# Patient Record
Sex: Male | Born: 1955 | Hispanic: No | Marital: Married | Smoking: Current every day smoker
Health system: Southern US, Community
[De-identification: ages and names within clinical notes are randomized; demographics above are authoritative.]

## PROBLEM LIST (undated history)

## (undated) DIAGNOSIS — G2581 Restless legs syndrome: Secondary | ICD-10-CM

## (undated) DIAGNOSIS — K219 Gastro-esophageal reflux disease without esophagitis: Secondary | ICD-10-CM

## (undated) DIAGNOSIS — I2510 Atherosclerotic heart disease of native coronary artery without angina pectoris: Secondary | ICD-10-CM

## (undated) DIAGNOSIS — E559 Vitamin D deficiency, unspecified: Secondary | ICD-10-CM

## (undated) DIAGNOSIS — E119 Type 2 diabetes mellitus without complications: Secondary | ICD-10-CM

## (undated) DIAGNOSIS — G4733 Obstructive sleep apnea (adult) (pediatric): Secondary | ICD-10-CM

## (undated) DIAGNOSIS — E7800 Pure hypercholesterolemia, unspecified: Secondary | ICD-10-CM

## (undated) DIAGNOSIS — M1990 Unspecified osteoarthritis, unspecified site: Secondary | ICD-10-CM

## (undated) DIAGNOSIS — Z8679 Personal history of other diseases of the circulatory system: Secondary | ICD-10-CM

## (undated) DIAGNOSIS — I712 Thoracic aortic aneurysm, without rupture, unspecified: Secondary | ICD-10-CM

## (undated) DIAGNOSIS — I10 Essential (primary) hypertension: Secondary | ICD-10-CM

## (undated) DIAGNOSIS — M545 Low back pain, unspecified: Secondary | ICD-10-CM

## (undated) DIAGNOSIS — G4730 Sleep apnea, unspecified: Secondary | ICD-10-CM

## (undated) DIAGNOSIS — M722 Plantar fascial fibromatosis: Secondary | ICD-10-CM

## (undated) DIAGNOSIS — Z972 Presence of dental prosthetic device (complete) (partial): Secondary | ICD-10-CM

## (undated) HISTORY — DX: Pure hypercholesterolemia, unspecified: E78.00

## (undated) HISTORY — DX: Thoracic aortic aneurysm, without rupture, unspecified: I71.20

## (undated) HISTORY — DX: Plantar fascial fibromatosis: M72.2

## (undated) HISTORY — PX: NECK SURGERY: SHX720

## (undated) HISTORY — DX: Obstructive sleep apnea (adult) (pediatric): G47.33

## (undated) HISTORY — PX: OTHER SURGICAL HISTORY: SHX169

---

## 2002-08-28 ENCOUNTER — Encounter: Admission: RE | Admit: 2002-08-28 | Discharge: 2002-08-28 | Payer: Self-pay | Admitting: Family Medicine

## 2002-08-29 ENCOUNTER — Encounter: Admission: RE | Admit: 2002-08-29 | Discharge: 2002-08-29 | Payer: Self-pay | Admitting: Family Medicine

## 2004-04-27 ENCOUNTER — Ambulatory Visit: Payer: Self-pay | Admitting: Family Medicine

## 2004-05-18 ENCOUNTER — Ambulatory Visit (HOSPITAL_COMMUNITY): Admission: RE | Admit: 2004-05-18 | Discharge: 2004-05-18 | Payer: Self-pay | Admitting: Sports Medicine

## 2004-07-15 ENCOUNTER — Ambulatory Visit: Payer: Self-pay | Admitting: Family Medicine

## 2004-10-31 ENCOUNTER — Emergency Department (HOSPITAL_COMMUNITY): Admission: EM | Admit: 2004-10-31 | Discharge: 2004-10-31 | Payer: Self-pay | Admitting: Emergency Medicine

## 2005-05-28 ENCOUNTER — Emergency Department (HOSPITAL_COMMUNITY): Admission: EM | Admit: 2005-05-28 | Discharge: 2005-05-28 | Payer: Self-pay | Admitting: Emergency Medicine

## 2005-06-22 ENCOUNTER — Ambulatory Visit: Payer: Self-pay | Admitting: Sports Medicine

## 2005-12-20 ENCOUNTER — Ambulatory Visit: Payer: Self-pay | Admitting: Family Medicine

## 2005-12-20 ENCOUNTER — Encounter: Admission: RE | Admit: 2005-12-20 | Discharge: 2005-12-20 | Payer: Self-pay | Admitting: Sports Medicine

## 2005-12-20 ENCOUNTER — Ambulatory Visit (HOSPITAL_COMMUNITY): Admission: RE | Admit: 2005-12-20 | Discharge: 2005-12-20 | Payer: Self-pay | Admitting: Family Medicine

## 2006-03-29 DIAGNOSIS — F17200 Nicotine dependence, unspecified, uncomplicated: Secondary | ICD-10-CM

## 2006-03-29 DIAGNOSIS — R079 Chest pain, unspecified: Secondary | ICD-10-CM

## 2006-03-29 DIAGNOSIS — E7800 Pure hypercholesterolemia, unspecified: Secondary | ICD-10-CM

## 2006-03-29 DIAGNOSIS — M545 Low back pain: Secondary | ICD-10-CM

## 2007-01-01 ENCOUNTER — Encounter (INDEPENDENT_AMBULATORY_CARE_PROVIDER_SITE_OTHER): Payer: Self-pay | Admitting: *Deleted

## 2007-01-01 ENCOUNTER — Ambulatory Visit: Payer: Self-pay | Admitting: Family Medicine

## 2007-01-01 ENCOUNTER — Telehealth: Payer: Self-pay | Admitting: *Deleted

## 2007-01-01 DIAGNOSIS — E119 Type 2 diabetes mellitus without complications: Secondary | ICD-10-CM

## 2007-01-01 LAB — CONVERTED CEMR LAB
ALT: 40 units/L (ref 0–53)
AST: 25 units/L (ref 0–37)
Albumin: 4.5 g/dL (ref 3.5–5.2)
Alkaline Phosphatase: 92 units/L (ref 39–117)
BUN: 11 mg/dL (ref 6–23)
Bilirubin Urine: NEGATIVE
Blood Glucose, AC Bkfst: 210 mg/dL
Blood in Urine, dipstick: NEGATIVE
CO2: 26 meq/L (ref 19–32)
Calcium: 9.8 mg/dL (ref 8.4–10.5)
Chloride: 99 meq/L (ref 96–112)
Creatinine, Ser: 0.89 mg/dL (ref 0.40–1.50)
Glucose, Bld: 198 mg/dL — ABNORMAL HIGH (ref 70–99)
Glucose, Urine, Semiquant: 100
Hgb A1c MFr Bld: 9.6 %
Ketones, urine, test strip: NEGATIVE
Nitrite: NEGATIVE
Potassium: 4.3 meq/L (ref 3.5–5.3)
Protein, U semiquant: NEGATIVE
Sodium: 136 meq/L (ref 135–145)
Specific Gravity, Urine: 1.01
Total Bilirubin: 0.7 mg/dL (ref 0.3–1.2)
Total Protein: 7.7 g/dL (ref 6.0–8.3)
Urobilinogen, UA: 0.2
WBC Urine, dipstick: NEGATIVE
pH: 6

## 2007-01-14 ENCOUNTER — Encounter: Payer: Self-pay | Admitting: Family Medicine

## 2007-05-09 ENCOUNTER — Encounter: Payer: Self-pay | Admitting: Family Medicine

## 2007-05-20 LAB — CONVERTED CEMR LAB
Cholesterol: 247 mg/dL — ABNORMAL HIGH (ref 0–200)
Glucose, Bld: 125 mg/dL — ABNORMAL HIGH (ref 70–99)
HDL: 44 mg/dL (ref >39)
LDL Cholesterol: 145 mg/dL — ABNORMAL HIGH (ref 0–99)
Total CHOL/HDL Ratio: 5.6
Triglycerides: 292 mg/dL — ABNORMAL HIGH (ref <150)
VLDL: 58 mg/dL — ABNORMAL HIGH (ref 0–40)

## 2007-12-31 ENCOUNTER — Ambulatory Visit: Payer: Self-pay | Admitting: Family Medicine

## 2007-12-31 ENCOUNTER — Encounter: Payer: Self-pay | Admitting: Family Medicine

## 2007-12-31 LAB — CONVERTED CEMR LAB
ALT: 21 units/L (ref 0–53)
AST: 14 units/L (ref 0–37)
Albumin: 4.5 g/dL (ref 3.5–5.2)
Alkaline Phosphatase: 83 units/L (ref 39–117)
BUN: 17 mg/dL (ref 6–23)
CO2: 23 meq/L (ref 19–32)
Calcium: 9.8 mg/dL (ref 8.4–10.5)
Chloride: 102 meq/L (ref 96–112)
Creatinine, Ser: 0.77 mg/dL (ref 0.40–1.50)
Direct LDL: 209 mg/dL — ABNORMAL HIGH
Glucose, Bld: 132 mg/dL — ABNORMAL HIGH (ref 70–99)
Hgb A1c MFr Bld: 7.3 %
Potassium: 4.1 meq/L (ref 3.5–5.3)
Sodium: 137 meq/L (ref 135–145)
Total Bilirubin: 0.5 mg/dL (ref 0.3–1.2)
Total Protein: 7.5 g/dL (ref 6.0–8.3)

## 2008-01-01 ENCOUNTER — Encounter: Payer: Self-pay | Admitting: Family Medicine

## 2008-02-05 ENCOUNTER — Encounter: Payer: Self-pay | Admitting: Family Medicine

## 2008-02-05 ENCOUNTER — Ambulatory Visit: Payer: Self-pay | Admitting: Family Medicine

## 2008-02-05 LAB — CONVERTED CEMR LAB: PSA: 0.37 ng/mL (ref 0.10–4.00)

## 2008-02-19 ENCOUNTER — Encounter: Payer: Self-pay | Admitting: Family Medicine

## 2008-04-25 ENCOUNTER — Emergency Department (HOSPITAL_BASED_OUTPATIENT_CLINIC_OR_DEPARTMENT_OTHER): Admission: EM | Admit: 2008-04-25 | Discharge: 2008-04-25 | Payer: Self-pay | Admitting: Emergency Medicine

## 2008-04-28 ENCOUNTER — Encounter: Payer: Self-pay | Admitting: Family Medicine

## 2008-05-22 ENCOUNTER — Ambulatory Visit: Payer: Self-pay | Admitting: Family Medicine

## 2008-05-22 ENCOUNTER — Encounter: Payer: Self-pay | Admitting: Family Medicine

## 2008-05-22 ENCOUNTER — Encounter: Admission: RE | Admit: 2008-05-22 | Discharge: 2008-05-22 | Payer: Self-pay | Admitting: Family Medicine

## 2008-05-22 DIAGNOSIS — R634 Abnormal weight loss: Secondary | ICD-10-CM

## 2008-05-22 LAB — CONVERTED CEMR LAB
ALT: 18 units/L (ref 0–53)
AST: 19 units/L (ref 0–37)
Albumin: 4.6 g/dL (ref 3.5–5.2)
Alkaline Phosphatase: 60 units/L (ref 39–117)
BUN: 15 mg/dL (ref 6–23)
CO2: 23 meq/L (ref 19–32)
Calcium: 9.7 mg/dL (ref 8.4–10.5)
Chloride: 103 meq/L (ref 96–112)
Creatinine, Ser: 0.85 mg/dL (ref 0.40–1.50)
Glucose, Bld: 86 mg/dL (ref 70–99)
HCT: 47.7 % (ref 39.0–52.0)
Hemoglobin: 15.9 g/dL (ref 13.0–17.0)
Hgb A1c MFr Bld: 6.3 %
MCHC: 33.3 g/dL (ref 30.0–36.0)
MCV: 87.4 fL (ref 78.0–100.0)
Platelets: 256 10*3/uL (ref 150–400)
Potassium: 4.7 meq/L (ref 3.5–5.3)
RBC: 5.46 M/uL (ref 4.22–5.81)
RDW: 13.8 % (ref 11.5–15.5)
Sed Rate: 4 mm/hr (ref 0–16)
Sodium: 141 meq/L (ref 135–145)
Total Bilirubin: 0.4 mg/dL (ref 0.3–1.2)
Total Protein: 7.8 g/dL (ref 6.0–8.3)
WBC: 7.2 10*3/uL (ref 4.0–10.5)

## 2008-05-23 ENCOUNTER — Encounter: Payer: Self-pay | Admitting: Family Medicine

## 2008-05-29 ENCOUNTER — Telehealth: Payer: Self-pay | Admitting: Family Medicine

## 2008-06-08 ENCOUNTER — Encounter: Payer: Self-pay | Admitting: Family Medicine

## 2008-06-11 ENCOUNTER — Encounter: Payer: Self-pay | Admitting: Family Medicine

## 2008-06-16 ENCOUNTER — Encounter: Payer: Self-pay | Admitting: Family Medicine

## 2008-06-18 ENCOUNTER — Encounter: Payer: Self-pay | Admitting: Family Medicine

## 2008-06-23 ENCOUNTER — Ambulatory Visit: Payer: Self-pay | Admitting: Family Medicine

## 2008-06-23 DIAGNOSIS — K644 Residual hemorrhoidal skin tags: Secondary | ICD-10-CM | POA: Insufficient documentation

## 2008-06-23 DIAGNOSIS — L259 Unspecified contact dermatitis, unspecified cause: Secondary | ICD-10-CM

## 2008-06-30 ENCOUNTER — Telehealth: Payer: Self-pay | Admitting: *Deleted

## 2008-10-20 ENCOUNTER — Ambulatory Visit: Payer: Self-pay | Admitting: Family Medicine

## 2008-10-20 LAB — CONVERTED CEMR LAB: Hgb A1c MFr Bld: 6.4 %

## 2008-11-17 ENCOUNTER — Encounter: Payer: Self-pay | Admitting: Family Medicine

## 2009-02-08 ENCOUNTER — Ambulatory Visit (HOSPITAL_COMMUNITY): Admission: RE | Admit: 2009-02-08 | Discharge: 2009-02-08 | Payer: Self-pay | Admitting: Family Medicine

## 2009-02-08 ENCOUNTER — Ambulatory Visit: Payer: Self-pay | Admitting: Family Medicine

## 2009-02-08 ENCOUNTER — Telehealth: Payer: Self-pay | Admitting: Family Medicine

## 2009-02-08 ENCOUNTER — Encounter: Payer: Self-pay | Admitting: Family Medicine

## 2009-02-08 DIAGNOSIS — R0789 Other chest pain: Secondary | ICD-10-CM | POA: Insufficient documentation

## 2009-02-08 DIAGNOSIS — M62838 Other muscle spasm: Secondary | ICD-10-CM

## 2009-02-08 DIAGNOSIS — R05 Cough: Secondary | ICD-10-CM

## 2009-02-08 LAB — CONVERTED CEMR LAB
BUN: 20 mg/dL (ref 6–23)
CO2: 21 meq/L (ref 19–32)
Calcium: 9.1 mg/dL (ref 8.4–10.5)
Chloride: 104 meq/L (ref 96–112)
Creatinine, Ser: 0.78 mg/dL (ref 0.40–1.50)
Glucose, Bld: 99 mg/dL (ref 70–99)
HCT: 47.2 % (ref 39.0–52.0)
Hemoglobin: 15.9 g/dL (ref 13.0–17.0)
MCHC: 33.7 g/dL (ref 30.0–36.0)
MCV: 87.4 fL (ref 78.0–100.0)
Platelets: 261 10*3/uL (ref 150–400)
Potassium: 4.3 meq/L (ref 3.5–5.3)
RBC: 5.4 M/uL (ref 4.22–5.81)
RDW: 12.5 % (ref 11.5–15.5)
Sodium: 139 meq/L (ref 135–145)
WBC: 7.7 10*3/uL (ref 4.0–10.5)

## 2009-02-11 ENCOUNTER — Encounter: Payer: Self-pay | Admitting: Family Medicine

## 2009-11-08 ENCOUNTER — Telehealth: Payer: Self-pay | Admitting: Family Medicine

## 2010-03-01 NOTE — Assessment & Plan Note (Signed)
Summary: sharp chest pain//Adrian Bernard   Vital Signs:  Patient profile:   55 year old male Weight:      162.0 pounds Temp:     97.4 degrees F oral Pulse rate:   72 / minute Pulse rhythm:   regular BP sitting:   111 / 73  (left arm) Cuff size:   regular  Vitals Entered By: Loralee Pacas CMA (February 08, 2009 2:06 PM) CC: chest pain x 1 week Comments pt stated that 10 years ago he had an accident where a dolley hit him in his chest and broke a rib and the pain feels like that.  No problems breathing, hurts when he coughs, pt also stated that he does heavy lifting.   Primary Care Provider:  Asher Muir MD  CC:  chest pain x 1 week.  History of Present Illness: 55 year old male:  1. Chest Pain: right side, anterior, no radiation, sharp, constant, x 1 week, leaning forward/laying on back/moving arm makes worse, nothing makes better including Ibuprofen. He was in an accident  ~ 10 years ago in which a dolley hit him in the chest and broke a rib, states that this pain is similar. He has been lifting heavy objects recently. No trauma. He denies dyspnea, palpitations, exertional pain, HA, dizziness, increased cough, LE edema, N/V/D, dyspepsia. He is a smoker (1 ppd x 20 years), DMII (A1c 6.4 on 9/21), and has a FamHx of cardiac disease.  Habits & Providers  Alcohol-Tobacco-Diet     Tobacco Status: current     Tobacco Counseling: to quit use of tobacco products     Cigarette Packs/Day: 1.0     Pack years: 20  Current Medications (verified): 1)  Prodigy Glucometer With [employer] .... Check Blood Sugar Twice A Day Dx: Diabetes Type 2 2)  Simvastatin 80 Mg Tabs (Simvastatin) .... Take 1 Tab By Mouth in The Evening 3)  Lisinopril 5 Mg Tabs (Lisinopril) .... Take 1 Tab By Mouth Daily 4)  Viagra 25 Mg Tabs (Sildenafil Citrate) .... Take 1 Tab By Mouth 1/2-4 Hours Before Intercourse 5)  Metformin Hcl 1000 Mg Tabs (Metformin Hcl) .... Take 1 Tab in The Morning and 1 Tab in The  Evening 6)  Triamcinolone Acetonide 0.1 % Oint (Triamcinolone Acetonide) .... Apply To Rash Two Times A Day Until Gone 7)  Anusol-Hc 2.5 % Crea (Hydrocortisone) .... Apply To Hemorrhoids Two Times A Day 8)  Vicodin 5-500 Mg Tabs (Hydrocodone-Acetaminophen) .Marland Kitchen.. 1 Tab By Mouth At Bedtime For Next 10 Days 9)  Ventolin Hfa 108 (90 Base) Mcg/act Aers (Albuterol Sulfate) .... 2 Puffs Inhaled Q 4 Hours As Needed Cough 10)  Hydrocodone-Acetaminophen 10-325 Mg Tabs (Hydrocodone-Acetaminophen) .Marland Kitchen.. 1 By Mouth Up To 4 Time Per Day As Needed For Pain 11)  Flexeril 10 Mg Tabs (Cyclobenzaprine Hcl) .... One By Mouth Three Times A Day As Needed For Muscle Spasm 12)  Aspirin 81 Mg  Tbec (Aspirin) .... One By Mouth Every Day  Allergies (verified): No Known Drug Allergies PMH-FH-SH reviewed for relevance  Social History: Smoking Status:  current  Review of Systems       He denies dyspnea, palpitations, exertional pain, HA, dizziness, increased cough, LE edema, N/V/D.  Physical Exam  General:  Well-developed, well-nourished, in no acute distress; alert,appropriate and cooperative throughout examination. Sometimes hold right chest when coughs. Vitals reviewed. Neck:  No deformities, masses, or tenderness noted. Lungs:  Decreased breath sounds bilaterally, no wheeze, normal effort, no crackles. Heart:  Normal rate  and regular rhythm. S1 and S2 normal without gallop, murmur, click, rub or other extra sounds. Msk:  right pectoralis major hypertonic and tight, tender to palpation. no sternum or costochondral joint tenderness. Pulses:  R and L carotid, radial, dorsalis pedis and posterior tibial pulses are full and equal bilaterally. Extremities:  No LE edema. Skin:  Intact without suspicious lesions or rashes. Psych:  Oriented X3, normally interactive, good eye contact, and not anxious appearing.     Impression & Recommendations:  Problem # 1:  CHEST PAIN, ATYPICAL (ICD-786.59) Assessment New Likely  MSK in nature. Reviewed EKG - Normal. Will obtain CXR since patient is a long-time smoker and has a Hx of rib fracture. NO RED FLAGS. Will have patient follow up in 2-3 weeks to reassess. Rec stress test with patient's risk factors. Rx ASA. Orders: [facility]- Est  Level 4 (15176)  Problem # 2:  SPASM, MUSCLE (ICD-728.85) Assessment: New Rx Flexeril and Vicodin for acute spasm. Symptomatic treatment. No heavy lifting. Orders: [facility]- Est  Level 4 (99214)  Problem # 3:  DIABETES-TYPE 2 (ICD-250.00) Assessment: Unchanged  His updated medication list for this problem includes:    Lisinopril 5 Mg Tabs (Lisinopril) .Marland Kitchen... Take 1 tab by mouth daily    Metformin Hcl 1000 Mg Tabs (Metformin hcl) .Marland Kitchen... Take 1 tab in the morning and 1 tab in the evening    Aspirin 81 Mg Tbec (Aspirin) ..... One by mouth every day  Orders: [facility]- Est  Level 4 (16073)  Problem # 4:  TOBACCO DEPENDENCE (ICD-305.1) Assessment: Unchanged Advised patient to stop smoking. Orders: [facility]- Est  Level 4 (71062)  Complete Medication List: 1)  Prodigy Glucometer With Testing Supplies  .... Check blood sugar twice a day dx: diabetes type 2 2)  Simvastatin 80 Mg Tabs (Simvastatin) .... Take 1 tab by mouth in the evening 3)  Lisinopril 5 Mg Tabs (Lisinopril) .... Take 1 tab by mouth daily 4)  Viagra 25 Mg Tabs (Sildenafil citrate) .... Take 1 tab by mouth 1/2-4 hours before intercourse 5)  Metformin Hcl 1000 Mg Tabs (Metformin hcl) .... Take 1 tab in the morning and 1 tab in the evening 6)  Triamcinolone Acetonide 0.1 % Oint (Triamcinolone acetonide) .... Apply to rash two times a day until gone 7)  Anusol-hc 2.5 % Crea (Hydrocortisone) .... Apply to hemorrhoids two times a day 8)  Vicodin 5-500 Mg Tabs (Hydrocodone-acetaminophen) .Marland Kitchen.. 1 tab by mouth at bedtime for next 10 days 9)  Ventolin Hfa 108 (90 Base) Mcg/act Aers (Albuterol sulfate) .... 2 puffs inhaled q 4 hours as needed cough 10)  Hydrocodone-acetaminophen 10-325 Mg Tabs  (Hydrocodone-acetaminophen) .Marland Kitchen.. 1 by mouth up to 4 time per day as needed for pain 11)  Flexeril 10 Mg Tabs (Cyclobenzaprine hcl) .... One by mouth three times a day as needed for muscle spasm 12)  Aspirin 81 Mg Tbec (Aspirin) .... One by mouth every day  Other Orders: EKG- [facility] (EKG) Basic Met-[facility] ([address]85-46270) CBC-[facility] (35009) CXR- [facility] (CXR)  Patient Instructions: 1)  It was nice to meet you today. 2)  Your chest pain is likely caused by a muscle spasm. 3)  We are going to check some labs and get a chest xray. Prescriptions: ASPIRIN 81 MG  TBEC (ASPIRIN) one by mouth every day  #30 x 0   Entered and Authorized by:   Helane Rima DO   Signed by:   Helane Rima DO on 02/08/2009   Method used:   Print then Give  to Patient   RxID:   [id number] FLEXERIL 10 MG TABS (CYCLOBENZAPRINE HCL) one by mouth three times a day as needed for muscle spasm  #60 x 0   Entered and Authorized by:   Helane Rima DO   Signed by:   Helane Rima DO on 02/08/2009   Method used:   Print then Give to Patient   RxID:   [id number] HYDROCODONE-ACETAMINOPHEN 10-325 MG TABS (HYDROCODONE-ACETAMINOPHEN) 1 by mouth up to 4 time per day as needed for pain  #30 x 0   Entered and Authorized by:   Helane Rima DO   Signed by:   Helane Rima DO on 02/08/2009   Method used:   Print then Give to Patient   RxID:   [id number] ASPIRIN 81 MG  TBEC (ASPIRIN) one by mouth every day  #30 x 0   Entered by:   Helane Rima DO   Authorized by:   Marland Kitchen [facility]DEFAULTPROVIDER   Signed by:   Helane Rima DO on 02/08/2009   Method used:   Print then Give to Patient   RxID:   [id number] FLEXERIL 10 MG TABS (CYCLOBENZAPRINE HCL) one by mouth three times a day as needed for muscle spasm  #60 x 0   Entered by:   Helane Rima DO   Authorized by:   Marland Kitchen FPCDEFAULTPROVIDER   Signed by:   Helane Rima DO on 02/08/2009   Method used:   Print then Give to Patient   RxID:   [id number] HYDROCODONE-ACETAMINOPHEN  10-325 MG TABS (HYDROCODONE-ACETAMINOPHEN) 1 by mouth up to 4 time per day as needed for pain  #30 x 0   Entered by:   Helane Rima DO   Authorized by:   Marland Kitchen FPCDEFAULTPROVIDER   Signed by:   Helane Rima DO on 02/08/2009   Method used:   Print then Give to Patient   RxID:   [id number]   Prevention & Chronic Care Immunizations   Influenza vaccine: Fluvax 3+  (02/05/2008)   Influenza vaccine due: 01/01/2008    Tetanus booster: 08/03/2002: Done.   Tetanus booster due: 08/02/2012    Pneumococcal vaccine: Not documented  Colorectal Screening   Hemoccult: Not documented   Hemoccult due: Not Indicated    Colonoscopy: Not documented  Other Screening   PSA: 0.37  (02/05/2008)   PSA due due: 02/04/2009   Smoking status: current  (02/08/2009)   Smoking cessation counseling: yes  (02/05/2008)  Diabetes Mellitus   HgbA1C: 6.4  (10/20/2008)   Hemoglobin A1C due: 03/30/2008    Eye exam: normal  (04/28/2008)   Eye exam due: 04/28/2009    Foot exam: yes  (02/05/2008)   High risk foot: Not documented   Foot care education: Not documented   Foot exam due: 01/01/2008    Urine microalbumin/creatinine ratio: Not documented   Urine microalbumin/cr due: 01/01/2008    Diabetes flowsheet reviewed?: Yes   Progress toward A1C goal: At goal  Lipids   Total Cholesterol: 247  (05/09/2007)   LDL: 145  (05/09/2007)   LDL Direct: 209  (12/31/2007)   HDL: 44  (05/09/2007)   Triglycerides: 292  (05/09/2007)    SGOT (AST): 19  (05/22/2008)   SGPT (ALT): 18  (05/22/2008)   Alkaline phosphatase: 60  (05/22/2008)   Total bilirubin: 0.4  (05/22/2008)    Lipid flowsheet reviewed?: Yes   Progress toward LDL goal: Unchanged  Self-Management Support :   Personal Goals (by the next clinic visit) :     Personal A1C goal:  7  (10/20/2008)     Personal blood pressure goal: 130/80  (10/20/2008)     Personal LDL goal: 100  (10/20/2008)    Patient will work on the following items until the  next clinic visit to reach self-care goals:     Medications and monitoring: take my medicines every day  (02/08/2009)     Eating: drink diet soda or water instead of juice or soda, eat more vegetables, use fresh or frozen vegetables, eat foods that are low in salt, eat baked foods instead of fried foods, eat fruit for snacks and desserts, limit or avoid alcohol  (02/08/2009)     Activity: take a 30 minute walk every day, take the stairs instead of the elevator, park at the far end of the parking lot  (02/08/2009)    Diabetes self-management support: Written self-care plan  (02/08/2009)   Diabetes care plan printed    Lipid self-management support: Written self-care plan  (02/08/2009)   Lipid self-care plan printed.  Appended Document: sharp chest pain/[place]/Adrian Bernard Note that patient only received one Rx of vicodin and one Rx of flexeril.

## 2010-03-01 NOTE — Progress Notes (Signed)
Summary: Rx Req  Phone Note Refill Request Call back at Home Phone [phone] Message from:  Patient  Refills Requested: Medication #1:  SIMVASTATIN 80 MG TABS take 1 tab by mouth in the evening  Medication #2:  LISINOPRIL 5 MG TABS take 1 tab by mouth daily  Medication #3:  METFORMIN HCL 1000 MG TABS take 1 tab in the morning and 1 tab in the evening [facility]Y [facility] [address] (HE ALSO SAID HE GET IBOPHREN FOR HIS BACK 800 MG.  Initial call taken by: De Nurse,  November 08, 2009 1:50 PM  Follow-up for Phone Call        Rx faxed to pharmacy. Called patient because he has not been seen for cpe in over one year. He recently lost his medicaid and inquired about cost of services. I will send him information about orange card insurance. Urged him to make appointment ASAP.  Follow-up by: Lloyd Huger MD,  November 09, 2009 2:34 PM    New/Updated Medications: IBUPROFEN 800 MG TABS (IBUPROFEN) Take one by mouth q8 hours as needed for pain Prescriptions: IBUPROFEN 800 MG TABS (IBUPROFEN) Take one by mouth q8 hours as needed for pain  #30 x 0   Entered and Authorized by:   Lloyd Huger MD   Signed by:   Lloyd Huger MD on 11/09/2009   Method used:   Electronically to        [facility]. [phone]* (retail)       [address]       [place], [place]  [address]       Ph: [phone]       Fax: [phone]   RxID:   [id number] METFORMIN HCL 1000 MG TABS (METFORMIN HCL) take 1 tab in the morning and 1 tab in the evening  #180 x 0   Entered and Authorized by:   Lloyd Huger MD   Signed by:   Lloyd Huger MD on 11/09/2009   Method used:   Electronically to        [facility]. [phone]* (retail)       [address]       [place], [place]  [address]       Ph: [phone]       Fax: [phone]   RxID:   [id number] LISINOPRIL 5 MG TABS (LISINOPRIL) take 1 tab by mouth daily  #90 x 0   Entered and  Authorized by:   Lloyd Huger MD   Signed by:   Lloyd Huger MD on 11/09/2009   Method used:   Electronically to        [facility]. [phone]* (retail)       [address]       [place], [place]  [address]       Ph: [phone]       Fax: [phone]   RxID:   [id number] SIMVASTATIN 80 MG TABS (SIMVASTATIN) take 1 tab by mouth in the evening  #90 x 3   Entered and Authorized by:   Lloyd Huger MD   Signed by:   Lloyd Huger MD on 11/09/2009   Method used:   Electronically to        [facility]. [phone]* (retail)       [address]  [place], [place]  [address]       Ph: [phone]       Fax: [phone]   RxID:   [id number]

## 2010-03-01 NOTE — Progress Notes (Signed)
Summary: TRIAGE_ASAP  Phone Note Call from Patient Call back at Home Phone [phone]   Caller: Patient Summary of Call: having pain in chest - going on for a week Initial call taken by: De Nurse,  February 08, 2009 10:50 AM  Follow-up for Phone Call        cp x 5-7 days. unable to carry anything. pain with coughing. states it is a sharp pain.  denies SOB, pressure, diaphoresis, arm, shoulder or neck pain, nausea, back pain. worse when he lays down. it is disturbing his sleep. better when sitting. work in at 1:30. aware of wait. told him if he gets any of the symptoms I just reviewed with him, he was to call 911 & go to ED. he agreed with plan Follow-up by: Golden Circle RN,  February 08, 2009 10:55 AM

## 2011-01-03 ENCOUNTER — Ambulatory Visit
Admission: RE | Admit: 2011-01-03 | Discharge: 2011-01-03 | Disposition: A | Discharge status: Home / Self Care | Payer: Self-pay | Source: Ambulatory Visit | Attending: Internal Medicine | Admitting: Internal Medicine

## 2011-01-03 ENCOUNTER — Other Ambulatory Visit: Payer: Self-pay | Admitting: Internal Medicine

## 2011-01-03 DIAGNOSIS — R0781 Pleurodynia: Secondary | ICD-10-CM

## 2011-03-08 ENCOUNTER — Other Ambulatory Visit: Payer: Self-pay | Admitting: Family Medicine

## 2014-09-10 ENCOUNTER — Emergency Department (INDEPENDENT_AMBULATORY_CARE_PROVIDER_SITE_OTHER)
Admission: EM | Admit: 2014-09-10 | Discharge: 2014-09-10 | Disposition: A | Discharge status: Nursing Home (Includes ICF) | Payer: Self-pay | Source: Home / Self Care | Attending: Family Medicine | Admitting: Family Medicine

## 2014-09-10 ENCOUNTER — Encounter (HOSPITAL_COMMUNITY): Payer: Self-pay | Admitting: *Deleted

## 2014-09-10 ENCOUNTER — Emergency Department (HOSPITAL_COMMUNITY): Payer: Self-pay

## 2014-09-10 ENCOUNTER — Encounter (HOSPITAL_COMMUNITY): Payer: Self-pay

## 2014-09-10 ENCOUNTER — Emergency Department (HOSPITAL_COMMUNITY)
Admission: EM | Admit: 2014-09-10 | Discharge: 2014-09-10 | Disposition: A | Discharge status: Home / Self Care | Payer: Self-pay | Attending: Emergency Medicine | Admitting: Emergency Medicine

## 2014-09-10 DIAGNOSIS — R058 Other specified cough: Secondary | ICD-10-CM

## 2014-09-10 DIAGNOSIS — E785 Hyperlipidemia, unspecified: Secondary | ICD-10-CM | POA: Insufficient documentation

## 2014-09-10 DIAGNOSIS — Z7982 Long term (current) use of aspirin: Secondary | ICD-10-CM | POA: Insufficient documentation

## 2014-09-10 DIAGNOSIS — Z720 Tobacco use: Secondary | ICD-10-CM

## 2014-09-10 DIAGNOSIS — I10 Essential (primary) hypertension: Secondary | ICD-10-CM | POA: Insufficient documentation

## 2014-09-10 DIAGNOSIS — R079 Chest pain, unspecified: Secondary | ICD-10-CM

## 2014-09-10 DIAGNOSIS — E119 Type 2 diabetes mellitus without complications: Secondary | ICD-10-CM | POA: Insufficient documentation

## 2014-09-10 DIAGNOSIS — R0789 Other chest pain: Secondary | ICD-10-CM

## 2014-09-10 DIAGNOSIS — Z8639 Personal history of other endocrine, nutritional and metabolic disease: Secondary | ICD-10-CM

## 2014-09-10 DIAGNOSIS — R05 Cough: Secondary | ICD-10-CM | POA: Insufficient documentation

## 2014-09-10 DIAGNOSIS — Z79899 Other long term (current) drug therapy: Secondary | ICD-10-CM | POA: Insufficient documentation

## 2014-09-10 HISTORY — DX: Type 2 diabetes mellitus without complications: E11.9

## 2014-09-10 LAB — CBC WITH DIFFERENTIAL/PLATELET
BASOS ABS: 0.1 10*3/uL (ref 0.0–0.1)
Basophils Relative: 1 % (ref 0–1)
EOS ABS: 0.6 10*3/uL (ref 0.0–0.7)
Eosinophils Relative: 8 % — ABNORMAL HIGH (ref 0–5)
HCT: 44.3 % (ref 39.0–52.0)
Hemoglobin: 15.2 g/dL (ref 13.0–17.0)
LYMPHS ABS: 3.7 10*3/uL (ref 0.7–4.0)
Lymphocytes Relative: 44 % (ref 12–46)
MCH: 30 pg (ref 26.0–34.0)
MCHC: 34.3 g/dL (ref 30.0–36.0)
MCV: 87.5 fL (ref 78.0–100.0)
Monocytes Absolute: 0.7 10*3/uL (ref 0.1–1.0)
Monocytes Relative: 8 % (ref 3–12)
NEUTROS PCT: 39 % — AB (ref 43–77)
Neutro Abs: 3.3 10*3/uL (ref 1.7–7.7)
PLATELETS: 258 10*3/uL (ref 150–400)
RBC: 5.06 MIL/uL (ref 4.22–5.81)
RDW: 13.3 % (ref 11.5–15.5)
WBC: 8.5 10*3/uL (ref 4.0–10.5)

## 2014-09-10 LAB — BASIC METABOLIC PANEL
Anion gap: 10 (ref 5–15)
BUN: 10 mg/dL (ref 6–20)
CO2: 24 mmol/L (ref 22–32)
Calcium: 9.1 mg/dL (ref 8.9–10.3)
Chloride: 103 mmol/L (ref 101–111)
Creatinine, Ser: 0.79 mg/dL (ref 0.61–1.24)
GFR calc Af Amer: >60 mL/min (ref >60)
GFR calc non Af Amer: >60 mL/min (ref >60)
GLUCOSE: 98 mg/dL (ref 65–99)
POTASSIUM: 4.3 mmol/L (ref 3.5–5.1)
Sodium: 137 mmol/L (ref 135–145)

## 2014-09-10 LAB — CBG MONITORING, ED: Glucose-Capillary: 98 mg/dL (ref 65–99)

## 2014-09-10 LAB — I-STAT TROPONIN, ED: Troponin i, poc: 0 ng/mL (ref 0.00–0.08)

## 2014-09-10 MED ORDER — GI COCKTAIL ~~LOC~~
30.0000 mL | Freq: Once | ORAL | Status: AC
  Administered 2014-09-10: 30 mL via ORAL
  Filled 2014-09-10: qty 30

## 2014-09-10 MED ORDER — ASPIRIN 81 MG PO CHEW
324.0000 mg | CHEWABLE_TABLET | Freq: Once | ORAL | Status: AC
  Administered 2014-09-10: 324 mg via ORAL
  Filled 2014-09-10: qty 4

## 2014-09-10 MED ORDER — NAPROXEN 500 MG PO TABS: 500.0000 mg | ORAL_TABLET | Freq: Two times a day (BID) | ORAL | Status: DC | PRN

## 2014-09-10 MED ORDER — NITROGLYCERIN 0.4 MG SL SUBL: 0.4000 mg | SUBLINGUAL_TABLET | SUBLINGUAL | Status: DC | PRN

## 2014-09-10 MED ORDER — MORPHINE SULFATE 4 MG/ML IJ SOLN
4.0000 mg | Freq: Once | INTRAMUSCULAR | Status: DC
  Filled 2014-09-10: qty 1

## 2014-09-10 NOTE — ED Notes (Signed)
Patient transported to X-ray 

## 2014-09-10 NOTE — Discharge Instructions (Signed)
Your chest pain could be from a muscle strain in your left neck/shoulder. Your work up here was unremarkable which is reassuring. Use heat to the affected areas of pain, applying heat for 20 minutes every hour. Use tylenol or naprosyn as directed as needed for pain. Call the medicaid office to find out who your regular doctor will be once they fix your insurance issue, and follow up with them in 1 week for ongoing management of your medical conditions and recheck after today's visit. Follow up with the cardiologist in 1-2 weeks for ongoing evaluation after today's visit. STOP SMOKING. Return to the ER for changes or worsening symptoms.   Chest Wall Pain Chest wall pain is pain felt in or around the chest bones and muscles. It may take up to 6 weeks to get better. It may take longer if you are active. Chest wall pain can happen on its own. Other times, things like germs, injury, coughing, or exercise can cause the pain. HOME CARE   Avoid activities that make you tired or cause pain. Try not to use your chest, belly (abdominal), or side muscles. Do not use heavy weights.  Put ice on the sore area.  Put ice in a plastic bag.  Place a towel between your skin and the bag.  Leave the ice on for 15-20 minutes for the first 2 days.  Only take medicine as told by your doctor. GET HELP RIGHT AWAY IF:   You have more pain or are very uncomfortable.  You have a fever.  Your chest pain gets worse.  You have new problems.  You feel sick to your stomach (nauseous) or throw up (vomit).  You start to sweat or feel lightheaded.  You have a cough with mucus (phlegm).  You cough up blood. MAKE SURE YOU:   Understand these instructions.  Will watch your condition.  Will get help right away if you are not doing well or get worse. Document Released: 07/05/2007 Document Revised: 04/10/2011 Document Reviewed: 09/12/2010 [facility] Patient Information 2015 [place], [place]. This information is not  intended to replace advice given to you by your health care provider. Make sure you discuss any questions you have with your health care provider.  Cough, Adult  A cough is a reflex that helps clear your throat and airways. It can help heal the body or may be a reaction to an irritated airway. A cough may only last 2 or 3 weeks (acute) or may last more than 8 weeks (chronic).  CAUSES Acute cough:  Viral or bacterial infections. Chronic cough:  Infections.  Allergies.  Asthma.  Post-nasal drip.  Smoking.  Heartburn or acid reflux.  Some medicines.  Chronic lung problems (COPD).  Cancer. SYMPTOMS   Cough.  Fever.  Chest pain.  Increased breathing rate.  High-pitched whistling sound when breathing (wheezing).  Colored mucus that you cough up (sputum). TREATMENT   A bacterial cough may be treated with antibiotic medicine.  A viral cough must run its course and will not respond to antibiotics.  Your caregiver may recommend other treatments if you have a chronic cough. HOME CARE INSTRUCTIONS   Only take over-the-counter or prescription medicines for pain, discomfort, or fever as directed by your caregiver. Use cough suppressants only as directed by your caregiver.  Use a cold steam vaporizer or humidifier in your bedroom or home to help loosen secretions.  Sleep in a semi-upright position if your cough is worse at night.  Rest as needed.  Stop  smoking if you smoke. SEEK IMMEDIATE MEDICAL CARE IF:   You have pus in your sputum.  Your cough starts to worsen.  You cannot control your cough with suppressants and are losing sleep.  You begin coughing up blood.  You have difficulty breathing.  You develop pain which is getting worse or is uncontrolled with medicine.  You have a fever. MAKE SURE YOU:   Understand these instructions.  Will watch your condition.  Will get help right away if you are not doing well or get worse. Document Released:  07/15/2010 Document Revised: 04/10/2011 Document Reviewed: 07/15/2010 [facility] Patient Information 2015 [place], [place]. This information is not intended to replace advice given to you by your health care provider. Make sure you discuss any questions you have with your health care provider.  Smoking Cessation, Tips for Success If you are ready to quit smoking, congratulations! You have chosen to help yourself be healthier. Cigarettes bring nicotine, tar, carbon monoxide, and other irritants into your body. Your lungs, heart, and blood vessels will be able to work better without these poisons. There are many different ways to quit smoking. Nicotine gum, nicotine patches, a nicotine inhaler, or nicotine nasal spray can help with physical craving. Hypnosis, support groups, and medicines help break the habit of smoking. WHAT THINGS CAN I DO TO MAKE QUITTING EASIER?  Here are some tips to help you quit for good:  Pick a date when you will quit smoking completely. Tell all of your friends and family about your plan to quit on that date.  Do not try to slowly cut down on the number of cigarettes you are smoking. Pick a quit date and quit smoking completely starting on that day.  Throw away all cigarettes.   Clean and remove all ashtrays from your home, work, and car.  On a card, write down your reasons for quitting. Carry the card with you and read it when you get the urge to smoke.  Cleanse your body of nicotine. Drink enough water and fluids to keep your urine clear or pale yellow. Do this after quitting to flush the nicotine from your body.  Learn to predict your moods. Do not let a bad situation be your excuse to have a cigarette. Some situations in your life might tempt you into wanting a cigarette.  Never have "just one" cigarette. It leads to wanting another and another. Remind yourself of your decision to quit.  Change habits associated with smoking. If you smoked while driving or when  feeling stressed, try other activities to replace smoking. Stand up when drinking your coffee. Brush your teeth after eating. Sit in a different chair when you read the paper. Avoid alcohol while trying to quit, and try to drink fewer caffeinated beverages. Alcohol and caffeine may urge you to smoke.  Avoid foods and drinks that can trigger a desire to smoke, such as sugary or spicy foods and alcohol.  Ask people who smoke not to smoke around you.  Have something planned to do right after eating or having a cup of coffee. For example, plan to take a walk or exercise.  Try a relaxation exercise to calm you down and decrease your stress. Remember, you may be tense and nervous for the first 2 weeks after you quit, but this will pass.  Find new activities to keep your hands busy. Play with a pen, coin, or rubber band. Doodle or draw things on paper.  Brush your teeth right after eating. This will  help cut down on the craving for the taste of tobacco after meals. You can also try mouthwash.   Use oral substitutes in place of cigarettes. Try using lemon drops, carrots, cinnamon sticks, or chewing gum. Keep them handy so they are available when you have the urge to smoke.  When you have the urge to smoke, try deep breathing.  Designate your home as a nonsmoking area.  If you are a heavy smoker, ask your health care provider about a prescription for nicotine chewing gum. It can ease your withdrawal from nicotine.  Reward yourself. Set aside the cigarette money you save and buy yourself something nice.  Look for support from others. Join a support group or smoking cessation program. Ask someone at home or at work to help you with your plan to quit smoking.  Always ask yourself, "Do I need this cigarette or is this just a reflex?" Tell yourself, "Today, I choose not to smoke," or "I do not want to smoke." You are reminding yourself of your decision to quit.  Do not replace cigarette smoking with  electronic cigarettes (commonly called e-cigarettes). The safety of e-cigarettes is unknown, and some may contain harmful chemicals.  If you relapse, do not give up! Plan ahead and think about what you will do the next time you get the urge to smoke. HOW WILL I FEEL WHEN I QUIT SMOKING? You may have symptoms of withdrawal because your body is used to nicotine (the addictive substance in cigarettes). You may crave cigarettes, be irritable, feel very hungry, cough often, get headaches, or have difficulty concentrating. The withdrawal symptoms are only temporary. They are strongest when you first quit but will go away within 10-14 days. When withdrawal symptoms occur, stay in control. Think about your reasons for quitting. Remind yourself that these are signs that your body is healing and getting used to being without cigarettes. Remember that withdrawal symptoms are easier to treat than the major diseases that smoking can cause.  Even after the withdrawal is over, expect periodic urges to smoke. However, these cravings are generally short lived and will go away whether you smoke or not. Do not smoke! WHAT RESOURCES ARE AVAILABLE TO HELP ME QUIT SMOKING? Your health care provider can direct you to community resources or hospitals for support, which may include:  Group support.  Education.  Hypnosis.  Therapy. Document Released: 10/15/2003 Document Revised: 06/02/2013 Document Reviewed: 07/04/2012 [facility] Patient Information 2015 [place], [place]. This information is not intended to replace advice given to you by your health care provider. Make sure you discuss any questions you have with your health care provider.

## 2014-09-10 NOTE — ED Notes (Signed)
Arrive from urgent care via care link related chest pain for one week

## 2014-09-10 NOTE — ED Notes (Signed)
Pt return denies complaints

## 2014-09-10 NOTE — ED Provider Notes (Signed)
CSN: [id number]     Arrival date & time 09/10/14  1554 History   First MD Initiated Contact with Patient 09/10/14 1600     Chief Complaint  Patient presents with  . Chest Pain     (Consider location/radiation/quality/duration/timing/severity/associated sxs/prior Treatment) HPI Comments: BLANCHARD WILLHITE is a 59 y.o. male with a PMHx of DM2, HLD, HTN, and tobacco abuse, who presents to the ED with complaints of gradual onset left-sided chest pain that began one week ago. He reports the pain is 5/10 constant sharp tightness radiating to the left shoulder worse with certain movements, coughing, and laying on his left side as well as slight worsening with exertion, and unrelieved with ibuprofen. He states he has had a dry cough since yesterday. Denies changes with deep inspiration. Denies any jaw or neck pain, fevers, chills, hemoptysis, wheezing, shortness of breath, recent travel/surgery/immobilization, diaphoresis, lightheadedness, claudication, orthopnea, abdominal pain, nausea, vomiting, diarrhea, constipation, melena, hematochezia, dysuria, hematuria, numbness, tingling, weakness, headaches, or vision changes. No family history of DVT/PE, no personal history of cardiac disease, but with a positive family history of MI in his father, uncle, and brother. He has no primary care doctor.  Of note, chart review reveals that in 2011 he had CP x1wk which at that time he described as similar to when he broke a rib after a work-related accident years before. He was discharged with the diagnosis of atypical chest pain and muscle spasm. At this time he denies ever having chest pain in the past.   Patient is a 59 y.o. male presenting with chest pain. The history is provided by the patient and medical records. No language interpreter was used.  Chest Pain Pain location:  L chest Pain quality: sharp and tightness   Pain radiates to:  L shoulder Pain radiates to the back: no   Pain severity:  Moderate Onset  quality:  Gradual Duration:  1 week Timing:  Constant Progression:  Waxing and waning Chronicity:  New Context: movement and at rest   Relieved by:  Nothing Worsened by:  Movement and certain positions Ineffective treatments: ibuprofen. Associated symptoms: cough (dry cough x1 day)   Associated symptoms: no abdominal pain, no back pain, no claudication, no diaphoresis, no dizziness, no fever, no headache, no lower extremity edema, no nausea, no numbness, no orthopnea, no shortness of breath, no syncope, not vomiting and no weakness   Risk factors: diabetes mellitus, high cholesterol, hypertension, male sex and smoking   Risk factors: no immobilization, no prior DVT/PE and no surgery     No past medical history on file. No past surgical history on file. No family history on file. Social History  Substance Use Topics  . Smoking status: Current Every Day Smoker  . Smokeless tobacco: Not on file  . Alcohol Use: No    Review of Systems  Constitutional: Negative for fever, chills and diaphoresis.  Eyes: Negative for visual disturbance.  Respiratory: Positive for cough (dry cough x1 day). Negative for shortness of breath and wheezing.   Cardiovascular: Positive for chest pain. Negative for orthopnea, claudication, leg swelling and syncope.  Gastrointestinal: Negative for nausea, vomiting, abdominal pain, diarrhea, constipation and blood in stool.  Genitourinary: Negative for dysuria and hematuria.  Musculoskeletal: Negative for myalgias, back pain, arthralgias and neck pain.  Skin: Negative for color change.  Allergic/Immunologic: Positive for immunocompromised state (diabetic).  Neurological: Negative for dizziness, weakness, light-headedness, numbness and headaches.  Psychiatric/Behavioral: Negative for confusion.   10 Systems reviewed and are negative  for acute change except as noted in the HPI.    Allergies  Review of patient's allergies indicates no known allergies.  Home  Medications   Prior to Admission medications   Medication Sig Start Date End Date Taking? Authorizing Provider  albuterol (VENTOLIN HFA) 108 (90 BASE) MCG/ACT inhaler Inhale 2 puffs into the lungs every 4 (four) hours as needed. For cough     Historical Provider, MD  aspirin (ASPIR-81) 81 MG EC tablet Take 81 mg by mouth daily.      Historical Provider, MD  Blood Glucose Monitoring Suppl (PRODIGY BLOOD GLUCOSE MONITOR) W/DEVICE KIT by Does not apply route 2 (two) times daily. Dx. Diabetes type 2     Historical Provider, MD  cyclobenzaprine (FLEXERIL) 10 MG tablet Take 10 mg by mouth 3 (three) times daily as needed. For muscle spasm     Historical Provider, MD  hydrocortisone (ANUSOL-HC) 2.5 % rectal cream Place rectally 2 (two) times daily. Apply to hemorrhoids     Historical Provider, MD  lisinopril (PRINIVIL,ZESTRIL) 5 MG tablet Take 5 mg by mouth daily.      Historical Provider, MD  metFORMIN (GLUCOPHAGE) 1000 MG tablet Take 1,000 mg by mouth 2 (two) times daily. Take 1 tab in the morning and 1 tab in the evening     Historical Provider, MD  sildenafil (VIAGRA) 25 MG tablet Take 25 mg by mouth. Take 1/2-4 hours before intercourse     Historical Provider, MD  simvastatin (ZOCOR) 80 MG tablet Take 80 mg by mouth at bedtime.      Historical Provider, MD  triamcinolone (KENALOG) 0.1 % ointment Apply topically 2 (two) times daily. Apply to  Rash until gone     Historical Provider, MD   BP 115/62 mmHg  Pulse 67  Temp(Src) 98.1 F (36.7 C) (Oral)  Resp 18  SpO2 97%  Physical Exam  Constitutional: He is oriented to person, place, and time. Vital signs are normal. He appears well-developed and well-nourished.  Non-toxic appearance. No distress.  Afebrile, nontoxic, NAD  HENT:  Head: Normocephalic and atraumatic.  Mouth/Throat: Oropharynx is clear and moist and mucous membranes are normal.  Eyes: Conjunctivae and EOM are normal. Right eye exhibits no discharge. Left eye exhibits no discharge.    Neck: Normal range of motion. Neck supple. Muscular tenderness present. No spinous process tenderness present. No rigidity. Normal range of motion present.    FROM intact without spinous process TTP, no bony stepoffs or deformities, no paraspinous muscle TTP or muscle spasms but mild L trapezius muscle tenderness without spasm. No rigidity or meningeal signs. No bruising or swelling.   Cardiovascular: Normal rate, regular rhythm, normal heart sounds and intact distal pulses.  Exam reveals no gallop and no friction rub.   No murmur heard. RRR, nl s1/s2, no m/r/g, distal pulses intact, no pedal edema   Pulmonary/Chest: Effort normal and breath sounds normal. No respiratory distress. He has no decreased breath sounds. He has no wheezes. He has no rhonchi. He has no rales. He exhibits no tenderness, no crepitus, no deformity and no retraction.  CTAB in all lung fields, no w/r/r, no hypoxia or increased WOB, speaking in full sentences, SpO2 100% on RA  No chest wall tenderness, crepitus, retractions, or deformity  Abdominal: Soft. Normal appearance and bowel sounds are normal. He exhibits no distension. There is no tenderness. There is no rigidity, no rebound and no guarding.  Musculoskeletal: Normal range of motion.  MAE x4 Strength and sensation grossly intact  Distal pulses intact No pedal edema, neg homan's bilaterally   Neurological: He is alert and oriented to person, place, and time. He has normal strength. No sensory deficit.  Skin: Skin is warm, dry and intact. No rash noted.  Psychiatric: He has a normal mood and affect.  Nursing note and vitals reviewed.   ED Course  Procedures (including critical care time) Labs Review Labs Reviewed  CBC WITH DIFFERENTIAL/PLATELET - Abnormal; Notable for the following:    Neutrophils Relative % 39 (*)    Eosinophils Relative 8 (*)    All other components within normal limits  BASIC METABOLIC PANEL  CBG MONITORING, ED  Randolm Idol, ED     Imaging Review Dg Chest 2 View  09/10/2014   CLINICAL DATA:  Left upper chest pain radiating to the left arm.  EXAM: CHEST  2 VIEW  COMPARISON:  01/03/2011  FINDINGS: Both lungs are clear. Heart and mediastinum are within normal limits. The trachea is midline. No acute bone abnormality. No large pleural effusions.  IMPRESSION: No active cardiopulmonary disease.   Electronically Signed   By: Markus Daft M.D.   On: 09/10/2014 17:10   I, Camprubi-Soms, Patty Sermons, personally reviewed and evaluated these images and lab results as part of my medical decision-making.   EKG Interpretation   Date/Time:  Thursday September 10 2014 16:08:28 EDT Ventricular Rate:  55 PR Interval:  181 QRS Duration: 83 QT Interval:  396 QTC Calculation: 379 R Axis:   60 Text Interpretation:  Sinus rhythm Normal ECG Confirmed by KNOTT MD,  Quillian Quince ([id number]) on 09/10/2014 4:13:32 PM      MDM   Final diagnoses:  Atypical chest pain  Dry cough  History of diabetes mellitus, type II  Tobacco abuse    59 y.o. male here with 1wk of CP with certain positions/movements, started having dry cough last night. No SOB complaints to me. No hypoxia or tachycardia. Multiple RFs including smoking, HLD, HTN, DM, and family hx of MI. Doubt PE/DVT or dissection. Will get labs, EKG, CXR, and give ASA, morphine, GI cocktail, and NTG. No reproducibility of pain over chest, but mild L trapezius tenderness which could explain his symptoms. Will reassess shortly.   7:02 PM EKG unremarkable. Trop neg at 1wk s/p onset. BMP WNL. CBC WNL. CBG 98. CXR Clear. No ongoing symptoms after ASA and GI cocktail. HEART score 3, doubt ACS, likely musculoskeletal, therefore I feel pt can f/up as an outpt with a cardiologist. Pt has MCD and therefore will have him f/up with PCP listed on MCD card as well. Doubt need for ACS r/o admission. Discussed use of heat, and will send home with naprosyn for pain. I explained the diagnosis and have given explicit  precautions to return to the ER including for any other new or worsening symptoms. The patient understands and accepts the medical plan as it's been dictated and I have answered their questions. Discharge instructions concerning home care and prescriptions have been given. The patient is STABLE and is discharged to home in good condition.  BP 123/68 mmHg  Pulse 51  Temp(Src) 98.1 F (36.7 C) (Oral)  Resp 21  SpO2 99%  Meds ordered this encounter  Medications  . aspirin chewable tablet 324 mg    Sig:   . gi cocktail (Maalox,Lidocaine,Donnatal)    Sig:   . naproxen (NAPROSYN) 500 MG tablet    Sig: Take 1 tablet (500 mg total) by mouth 2 (two) times daily as needed for mild pain,  moderate pain or headache (TAKE WITH MEALS.).    Dispense:  20 tablet    Refill:  0    Order Specific Question:  Supervising Provider    Answer:  Noemi Chapel [[id number]]     Jessic Standifer Camprubi-Soms, PA-C 09/10/14 1909  Leo Grosser, MD 09/11/14 [phone]

## 2014-09-10 NOTE — ED Provider Notes (Addendum)
CSN: [id number]     Arrival date & time 09/10/14  1346 History   First MD Initiated Contact with Patient 09/10/14 1403     Chief Complaint  Patient presents with  . Chest Pain   (Consider location/radiation/quality/duration/timing/severity/associated sxs/prior Treatment) Patient is a 59 y.o. male presenting with chest pain. The history is provided by the patient.  Chest Pain Pain location:  L chest Pain quality: pressure and radiating   Pain radiates to:  L arm Pain radiates to the back: no   Onset quality:  Gradual Duration:  1 week Progression:  Waxing and waning Chronicity:  New Worsened by:  Certain positions Associated symptoms: shortness of breath   Associated symptoms: no abdominal pain, no back pain, no cough, no fever, no lower extremity edema and no palpitations   Risk factors: diabetes mellitus, male sex and smoking     Past Medical History  Diagnosis Date  . Diabetes mellitus without complication    History reviewed. No pertinent past surgical history. History reviewed. No pertinent family history. Social History  Substance Use Topics  . Smoking status: Current Every Day Smoker  . Smokeless tobacco: None  . Alcohol Use: No    Review of Systems  Constitutional: Negative.  Negative for fever.  HENT: Negative.   Respiratory: Positive for shortness of breath. Negative for cough and wheezing.   Cardiovascular: Positive for chest pain. Negative for palpitations and leg swelling.  Gastrointestinal: Negative for abdominal pain.  Musculoskeletal: Negative for back pain.    Allergies  Review of patient's allergies indicates no known allergies.  Home Medications   Prior to Admission medications   Medication Sig Start Date End Date Taking? Authorizing Provider  aspirin (ASPIR-81) 81 MG EC tablet Take 81 mg by mouth daily.      Historical Provider, MD  glimepiride (AMARYL) 2 MG tablet Take 2 mg by mouth daily. 08/17/14   Historical Provider, MD  glucose blood test  strip 1 each by Other route as needed for other.    Historical Provider, MD  ibuprofen (ADVIL,MOTRIN) 200 MG tablet Take 200 mg by mouth every 6 (six) hours as needed for mild pain or moderate pain.    Historical Provider, MD  metFORMIN (GLUCOPHAGE) 1000 MG tablet Take 1,000 mg by mouth 2 (two) times daily. Take 1 tab in the morning and 1 tab in the evening     Historical Provider, MD  naproxen (NAPROSYN) 500 MG tablet Take 1 tablet (500 mg total) by mouth 2 (two) times daily as needed for mild pain, moderate pain or headache (TAKE WITH MEALS.). 09/10/14   Mercedes Camprubi-Soms, PA-C  simvastatin (ZOCOR) 40 MG tablet Take 40 mg by mouth at bedtime. 08/15/14   Historical Provider, MD   BP 116/83 mmHg  Pulse 76  Temp(Src) 98.3 F (36.8 C) (Oral)  Resp 16  SpO2 97% Physical Exam  Constitutional: He is oriented to person, place, and time. He appears well-developed and well-nourished. No distress.  Neck: Normal range of motion. Neck supple.  Cardiovascular: Normal rate, regular rhythm, normal heart sounds and intact distal pulses.   Pulmonary/Chest: Effort normal and breath sounds normal. He exhibits no tenderness.  Neurological: He is alert and oriented to person, place, and time.  Skin: Skin is warm and dry.  Nursing note and vitals reviewed.   ED Course  Procedures (including critical care time) Labs Review Labs Reviewed - No data to display ED ECG REPORT   Date: 09/10/2014  Rate: 76  Rhythm: normal sinus  rhythm  QRS Axis: normal  Intervals: normal  ST/T Wave abnormalities: normal  Conduction Disutrbances:none  Narrative Interpretation:   Old EKG Reviewed: none available  I have personally reviewed the EKG tracing and agree with the computerized printout as noted.  Imaging Review Dg Chest 2 View  09/10/2014   CLINICAL DATA:  Left upper chest pain radiating to the left arm.  EXAM: CHEST  2 VIEW  COMPARISON:  01/03/2011  FINDINGS: Both lungs are clear. Heart and mediastinum are  within normal limits. The trachea is midline. No acute bone abnormality. No large pleural effusions.  IMPRESSION: No active cardiopulmonary disease.   Electronically Signed   By: Markus Daft M.D.   On: 09/10/2014 17:10     MDM   1. Chest pain syndrome   sent for coronary eval of cp in male with mult risk factors including smoking and poorly controlled diabetes. ecg wnl.     Billy Fischer, MD 09/10/14 [place], MD 09/10/14 2041

## 2014-09-10 NOTE — ED Notes (Signed)
Pt  Reports  Chest     Pain      With  Pain   When  He      coughhs  Or  Gets  In  Certain  Positions          Pt  Is  A  Smoker          Pt  Reports      Symptoms  Began  About  1  Week  Ago         pt is  A  diavbetic  Takes  meds  For  Same

## 2014-09-10 NOTE — ED Notes (Signed)
Pt placed  On  Cardiac  Monitor  Nasal  02  At  2  l  /  Min        Iv  Started  22 angio  l  Hand  1  Att

## 2015-05-18 ENCOUNTER — Ambulatory Visit
Admission: RE | Admit: 2015-05-18 | Discharge: 2015-05-18 | Disposition: A | Discharge status: Home / Self Care | Payer: No Typology Code available for payment source | Source: Ambulatory Visit | Attending: *Deleted | Admitting: *Deleted

## 2015-05-18 ENCOUNTER — Other Ambulatory Visit: Payer: Self-pay | Admitting: *Deleted

## 2015-05-18 DIAGNOSIS — R52 Pain, unspecified: Secondary | ICD-10-CM

## 2015-10-12 ENCOUNTER — Encounter: Payer: Self-pay | Admitting: Gastroenterology

## 2015-10-27 ENCOUNTER — Ambulatory Visit (AMBULATORY_SURGERY_CENTER): Payer: Self-pay

## 2015-10-27 VITALS — Ht 67.5 in | Wt 168.0 lb

## 2015-10-27 DIAGNOSIS — Z1211 Encounter for screening for malignant neoplasm of colon: Secondary | ICD-10-CM

## 2015-10-27 MED ORDER — SUPREP BOWEL PREP KIT 17.5-3.13-1.6 GM/177ML PO SOLN: 1.0000 | Freq: Once | ORAL | 0 refills | Status: AC

## 2015-10-27 NOTE — Progress Notes (Signed)
No allergies to eggs or soy No past problems with anesthesia No diet meds No home oxygen  Declined emmi 

## 2015-10-29 ENCOUNTER — Encounter: Payer: Self-pay | Admitting: Gastroenterology

## 2015-11-10 ENCOUNTER — Ambulatory Visit (AMBULATORY_SURGERY_CENTER): Payer: Medicaid Other | Admitting: Gastroenterology

## 2015-11-10 ENCOUNTER — Encounter: Payer: Self-pay | Admitting: Gastroenterology

## 2015-11-10 VITALS — BP 111/63 | HR 59 | Temp 98.2°F | Resp 12 | Ht 67.0 in | Wt 168.0 lb

## 2015-11-10 DIAGNOSIS — Z1212 Encounter for screening for malignant neoplasm of rectum: Secondary | ICD-10-CM

## 2015-11-10 DIAGNOSIS — Z1211 Encounter for screening for malignant neoplasm of colon: Secondary | ICD-10-CM

## 2015-11-10 DIAGNOSIS — D124 Benign neoplasm of descending colon: Secondary | ICD-10-CM

## 2015-11-10 LAB — GLUCOSE, CAPILLARY
GLUCOSE-CAPILLARY: 124 mg/dL — AB (ref 65–99)
Glucose-Capillary: 147 mg/dL — ABNORMAL HIGH (ref 65–99)

## 2015-11-10 MED ORDER — SODIUM CHLORIDE 0.9 % IV SOLN: 500.0000 mL | INTRAVENOUS | Status: DC

## 2015-11-10 NOTE — Progress Notes (Signed)
Called to room to assist during endoscopic procedure.  Patient ID and intended procedure confirmed with present staff. Received instructions for my participation in the procedure from the performing physician.  

## 2015-11-10 NOTE — Progress Notes (Signed)
  [facility] Post-op Note  Patient: Adrian Bernard  Procedure(s) Performed: colonoscopy  Patient Location: [facility] - Recovery Area  Anesthesia Type: Deep Sedation/Propofol  Level of Consciousness: awake, oriented and patient cooperative  Airway and Oxygen Therapy: Patient Spontanous Breathing  Post-op Pain: none  Post-op Assessment:  Post-op Vital signs reviewed, Patient's Cardiovascular Status Stable, Respiratory Function Stable, Patent Airway, No signs of Nausea or vomiting and Pain level controlled  Post-op Vital Signs: Reviewed and stable  Complications: No apparent anesthesia complications  Sharis Keeran E 12:21 PM

## 2015-11-10 NOTE — Patient Instructions (Signed)
YOU HAD AN ENDOSCOPIC PROCEDURE TODAY AT [facility]:   Refer to the procedure report that was given to you for any specific questions about what was found during the examination.  If the procedure report does not answer your questions, please call your gastroenterologist to clarify.  If you requested that your care partner not be given the details of your procedure findings, then the procedure report has been included in a sealed envelope for you to review at your convenience later.  YOU SHOULD EXPECT: Some feelings of bloating in the abdomen. Passage of more gas than usual.  Walking can help get rid of the air that was put into your GI tract during the procedure and reduce the bloating. If you had a lower endoscopy (such as a colonoscopy or flexible sigmoidoscopy) you may notice spotting of blood in your stool or on the toilet paper. If you underwent a bowel prep for your procedure, you may not have a normal bowel movement for a few days.  Please Note:  You might notice some irritation and congestion in your nose or some drainage.  This is from the oxygen used during your procedure.  There is no need for concern and it should clear up in a day or so.  SYMPTOMS TO REPORT IMMEDIATELY:   Following lower endoscopy (colonoscopy or flexible sigmoidoscopy):  Excessive amounts of blood in the stool  Significant tenderness or worsening of abdominal pains  Swelling of the abdomen that is new, acute  Fever of 100F or higher   For urgent or emergent issues, a gastroenterologist can be reached at any hour by calling [phone].   DIET:  We do recommend a small meal at first, but then you may proceed to your regular diet.  Drink plenty of fluids but you should avoid alcoholic beverages for 24 hours.  ACTIVITY:  You should plan to take it easy for the rest of today and you should NOT DRIVE or use heavy machinery until tomorrow (because of the sedation medicines used during the test).     FOLLOW UP: Our staff will call the number listed on your records the next business day following your procedure to check on you and address any questions or concerns that you may have regarding the information given to you following your procedure. If we do not reach you, we will leave a message.  However, if you are feeling well and you are not experiencing any problems, there is no need to return our call.  We will assume that you have returned to your regular daily activities without incident.  If any biopsies were taken you will be contacted by phone or by letter within the next 1-3 weeks.  Please call us at [phone] if you have not heard about the biopsies in 3 weeks.    SIGNATURES/CONFIDENTIALITY: You and/or your care partner have signed paperwork which will be entered into your electronic medical record.  These signatures attest to the fact that that the information above on your After Visit Summary has been reviewed and is understood.  Full responsibility of the confidentiality of this discharge information lies with you and/or your care-partner.  Polyps, diverticulosis, hemorrhoids, high fiber diet-handouts given  Repeat colonoscopy will be determined by pathology.

## 2015-11-10 NOTE — Op Note (Signed)
[place] Patient Name: Adrian Bernard Procedure Date: 11/10/2015 11:55 AM MRN: [id number] Endoscopist: Ladene Artist , MD Age: 60 Referring MD:  Date of Birth: 1955/04/29 Gender: Male Account #: [id number] Procedure:                Colonoscopy Indications:              Screening for colorectal malignant neoplasm Medicines:                Monitored Anesthesia Care Procedure:                Pre-Anesthesia Assessment:                           - Prior to the procedure, a History and Physical                            was performed, and patient medications and                            allergies were reviewed. The patient's tolerance of                            previous anesthesia was also reviewed. The risks                            and benefits of the procedure and the sedation                            options and risks were discussed with the patient.                            All questions were answered, and informed consent                            was obtained. Prior Anticoagulants: The patient has                            taken no previous anticoagulant or antiplatelet                            agents. ASA Grade Assessment: II - A patient with                            mild systemic disease. After reviewing the risks                            and benefits, the patient was deemed in                            satisfactory condition to undergo the procedure.                           After obtaining informed consent, the colonoscope  was passed under direct vision. Throughout the                            procedure, the patient's blood pressure, pulse, and                            oxygen saturations were monitored continuously. The                            Model PCF-H190L [id number]) scope was introduced                            through the anus and advanced to the the cecum,                            identified by  appendiceal orifice and ileocecal                            valve. The ileocecal valve, appendiceal orifice,                            and rectum were photographed. The quality of the                            bowel preparation was good. The colonoscopy was                            performed without difficulty. The patient tolerated                            the procedure well. Scope In: 12:03:19 PM Scope Out: 12:15:15 PM Scope Withdrawal Time: 0 hours 9 minutes 27 seconds  Total Procedure Duration: 0 hours 11 minutes 56 seconds  Findings:                 The perianal and digital rectal examinations were                            normal.                           A 6 mm polyp was found in the descending colon. The                            polyp was sessile. The polyp was removed with a                            cold snare. Resection and retrieval were complete.                           Internal hemorrhoids were found during                            retroflexion. The hemorrhoids were small and Grade  I (internal hemorrhoids that do not prolapse).                           A few medium-mouthed diverticula were found in the                            ascending colon and cecum.                           The exam was otherwise without abnormality on                            direct and retroflexion views. Complications:            No immediate complications. Estimated blood loss:                            None. Estimated Blood Loss:     Estimated blood loss: none. Impression:               - One 6 mm polyp in the descending colon, removed                            with a cold snare. Resected and retrieved.                           - Internal hemorrhoids.                           - Diverticulosis in the ascending colon and in the                            cecum.                           - The examination was otherwise normal on direct                             and retroflexion views. Recommendation:           - Repeat colonoscopy in 5 years for surveillance if                            polyp is precancerous, otherwise 10 years.                           - Patient has a contact number available for                            emergencies. The signs and symptoms of potential                            delayed complications were discussed with the                            patient. Return to normal activities tomorrow.  Written discharge instructions were provided to the                            patient.                           - Resume previous diet.                           - Continue present medications.                           - Await pathology results. Ladene Artist, MD 11/10/2015 12:18:10 PM This report has been signed electronically.

## 2015-11-11 ENCOUNTER — Telehealth: Payer: Self-pay | Admitting: *Deleted

## 2015-11-11 ENCOUNTER — Telehealth: Payer: Self-pay

## 2015-11-11 NOTE — Telephone Encounter (Signed)
  Follow up Call-  Call back number 11/10/2015  Post procedure Call Back phone  # [phone]  Permission to leave phone message Yes  Some recent data might be hidden     Patient questions:  Do you have a fever, pain , or abdominal swelling? No. Pain Score  0 *  Have you tolerated food without any problems? Yes.    Have you been able to return to your normal activities? Yes.    Do you have any questions about your discharge instructions: Diet   No. Medications  No. Follow up visit  No.  Do you have questions or concerns about your Care? No.  Actions: * If pain score is 4 or above: No action needed, pain <4.

## 2015-11-11 NOTE — Telephone Encounter (Signed)
  Follow up Call-  Call back number 11/10/2015  Post procedure Call Back phone  # [phone]  Permission to leave phone message Yes  Some recent data might be hidden     Patient questions:  Message left to call us if necessary.

## 2015-11-23 ENCOUNTER — Encounter: Payer: Self-pay | Admitting: Gastroenterology

## 2016-03-02 ENCOUNTER — Ambulatory Visit (INDEPENDENT_AMBULATORY_CARE_PROVIDER_SITE_OTHER): Payer: Medicaid Other | Admitting: Neurology

## 2016-03-02 ENCOUNTER — Encounter: Payer: Self-pay | Admitting: Neurology

## 2016-03-02 VITALS — BP 138/70 | HR 80 | Resp 18 | Ht 67.5 in | Wt 173.0 lb

## 2016-03-02 DIAGNOSIS — R0683 Snoring: Secondary | ICD-10-CM | POA: Diagnosis not present

## 2016-03-02 DIAGNOSIS — G4761 Periodic limb movement disorder: Secondary | ICD-10-CM | POA: Diagnosis not present

## 2016-03-02 DIAGNOSIS — G2581 Restless legs syndrome: Secondary | ICD-10-CM

## 2016-03-02 DIAGNOSIS — R400 Somnolence: Secondary | ICD-10-CM

## 2016-03-02 DIAGNOSIS — R351 Nocturia: Secondary | ICD-10-CM

## 2016-03-02 DIAGNOSIS — Z72821 Inadequate sleep hygiene: Secondary | ICD-10-CM

## 2016-03-02 DIAGNOSIS — Z9189 Other specified personal risk factors, not elsewhere classified: Secondary | ICD-10-CM

## 2016-03-02 DIAGNOSIS — Z789 Other specified health status: Secondary | ICD-10-CM

## 2016-03-02 NOTE — Progress Notes (Signed)
Subjective:    Patient ID: Adrian Bernard is a 61 y.o. male.  HPI     Star Age, MD, PhD [facility] [address] [place], [place] [address]  Dear Dr. Doreene Nest,   I saw your patient, Adrian Bernard, upon your kind request in my neurologic clinic today for initial consultation of his sleep disturbance and particularly concerning for restless leg syndrome. The patient is unacompanied today. As you know, [name] is a 61 year old right-handed gentleman with an underlying medical history of arthritis, diabetes, hyperlipidemia, hypertension, and overweight state, who reports symptoms of restless leg syndrome for the past 2-3 years, worse in the past 1 year, he has had muscle pains and stiffness in both legs and sometimes stiffness in hands and joints. He is worried about having rheumatism. I reviewed your office note from 02/22/2016, which you kindly included. He has been on ropinirole. He does not report improvement with it. He has been prescribed meloxicam as well as Flexeril. Recent laboratory testing included CBC, BMP, TSH, ESR, CRP, CK, rheumatoid factor, CCP antibodies, ANA and B12, all normal according to your notes. He had x-rays of the spine which showed mild degenerative changes. His Epworth sleepiness score is 8 out of 24, his fatigue score is 27 out of 63. Wife says, he snores, and moves a lot in his sleep, leg movements.  He smokes 1 ppd, has not recently tried to quit, no alcohol, but a lot of caffeine, 12 oz coffee cup, 5 per day, soda 1 daily or every other day. No AM HAs, but nocturia, 4-5 times nightly. Does not keep a sleep schedule: in bed between MN and 2 AM, sleeps on average 6 hours, but multiple interruptions.  he does not have a set wake up time. He does not work. 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 He lives with his wife and 3 children, ages 3, 5 and 56 months.  Was on Requip 0.25 mg one, but was told to increase to 2  pills some 2-3 weeks ago, no change reported, does not seem to help at all. Has been on it for about 6 mo from another MD. Only drinks 1-2 cups of water per day on average.  Has mouth dryness, flexeril has helped with pain, not the restlessness.    His Past Medical History Is Significant For: Past Medical History:  Diagnosis Date  . Diabetes mellitus without complication ([place])   . High cholesterol     His Past Surgical History Is Significant For: Past Surgical History:  Procedure Laterality Date  . dental implants      His Family History Is Significant For: Family History  Problem Relation Age of Onset  . Heart disease Father   . Diabetes Brother   . Heart disease Brother   . Diabetes Brother   . Heart disease Brother   . Colon cancer Neg Hx     His Social History Is Significant For: Social History   Social History  . Marital status: Married    Spouse name: N/A  . Number of children: 3  . Years of education: Assoc    Social History Main Topics  . Smoking status: Current Every Day Smoker    Packs/day: 1.00    Types: Cigarettes  . Smokeless tobacco: Never Used  . Alcohol use No  . Drug use: No  . Sexual activity: Not Asked   Other Topics Concern  . None   Social History Narrative  Drinks about 4-5 cups of coffee a day     His Allergies Are:  No Known Allergies:   His Current Medications Are:  Outpatient Encounter Prescriptions as of 03/02/2016  Medication Sig  . aspirin (ASPIR-81) 81 MG EC tablet Take 81 mg by mouth daily.    . cyclobenzaprine (FLEXERIL) 10 MG tablet Take by mouth.  Marland Kitchen JANUVIA 100 MG tablet   . meloxicam (MOBIC) 15 MG tablet Take by mouth.  . metFORMIN (GLUCOPHAGE) 1000 MG tablet Take 1,000 mg by mouth 2 (two) times daily. Take 1 tab in the morning and 1 tab in the evening   . pravastatin (PRAVACHOL) 80 MG tablet Take by mouth.  Marland Kitchen rOPINIRole (REQUIP) 0.25 MG tablet Take 0.25 mg by mouth at bedtime.  . [DISCONTINUED] ACCU-CHEK FASTCLIX  LANCETS MISC USE UTD  . [DISCONTINUED] Blood Glucose Monitoring Suppl (ACCU-CHEK GUIDE) w/Device KIT U UTD TO CHECK BLOOD SUGAR  . [DISCONTINUED] glucose blood test strip 1 each by Other route as needed for other.  . [DISCONTINUED] glucose monitoring kit (FREESTYLE) monitoring kit by Does not apply route.  . [DISCONTINUED] ibuprofen (ADVIL,MOTRIN) 200 MG tablet Take 200 mg by mouth every 6 (six) hours as needed for mild pain or moderate pain.  . [DISCONTINUED] Lancets (ONETOUCH ULTRASOFT) lancets Use as instructed  . [DISCONTINUED] naproxen (NAPROSYN) 500 MG tablet Take 1 tablet (500 mg total) by mouth 2 (two) times daily as needed for mild pain, moderate pain or headache (TAKE WITH MEALS.). (Patient not taking: Reported on 11/10/2015)  . [DISCONTINUED] simvastatin (ZOCOR) 40 MG tablet Take 40 mg by mouth at bedtime.   Facility-Administered Encounter Medications as of 03/02/2016  Medication  . 0.9 %  sodium chloride infusion  : Review of Systems:  Out of a complete 14 point review of systems, all are reviewed and negative with the exception of these symptoms as listed below: Review of Systems  Neurological:       Patient reports that he has stiffness and "jerking" in his legs at night. Has trouble falling asleep due to this.    Epworth Sleepiness Scale 0= would never doze 1= slight chance of dozing 2= moderate chance of dozing 3= high chance of dozing  Sitting and reading:3 Watching TV:2 Sitting inactive in a public place (ex. Theater or meeting):1 As a passenger in a car for an hour without a break:0 Lying down to rest in the afternoon:0 Sitting and talking to someone:0 Sitting quietly after lunch (no alcohol):2 In a car, while stopped in traffic:0 Total:8  Objective:  Neurologic Exam  Physical Exam Physical Examination:   Vitals:   03/02/16 1003  BP: 138/70  Pulse: 80  Resp: 18   General Examination: The patient is a very pleasant 61 y.o. male in no acute distress. He  appears well-developed and well-nourished and adequately groomed.   HEENT: Normocephalic, atraumatic, pupils are equal, round and reactive to light and accommodation. Funduscopic exam is normal with sharp disc margins noted. Mild cataracts, has readers. Extraocular tracking is good without limitation to gaze excursion or nystagmus noted. Normal smooth pursuit is noted. Hearing is grossly intact. Tympanic membranes are clear bilaterally. Face is symmetric with normal facial animation and normal facial sensation. Speech is clear with no dysarthria noted. There is no hypophonia. There is no lip, neck/head, jaw or voice tremor. Neck is supple with full range of passive and active motion. There are no carotid bruits on auscultation. Oropharynx exam reveals: mild mouth dryness, dentures on top, no teeth on  bottom. Moderate airway crowding, due to larger uvula, tonsils of 1-2+ and longer tongue. Mallampati is class II. Tongue protrudes centrally and palate elevates symmetrically. Neck size is 15.75 inches.    Chest: Clear to auscultation without wheezing, rhonchi or crackles noted.  Heart: S1+S2+0, regular and normal without murmurs, rubs or gallops noted.   Abdomen: Soft, non-tender and non-distended with normal bowel sounds appreciated on auscultation.  Extremities: There is no pitting edema in the distal lower extremities bilaterally. Pedal pulses are intact.  Skin: Warm and dry without trophic changes noted. There are no varicose veins.  Musculoskeletal: exam reveals no obvious joint deformities, tenderness or joint swelling or erythema.   Neurologically:  Mental status: The patient is awake, alert and oriented in all 4 spheres. His immediate and remote memory, attention, language skills and fund of knowledge are appropriate. There is no evidence of aphasia, agnosia, apraxia or anomia. Speech is clear with normal prosody and enunciation. Thought process is linear. Mood is normal and affect is normal.   Cranial nerves II - XII are as described above under HEENT exam. In addition: shoulder shrug is normal with equal shoulder height noted. Motor exam: Normal bulk, strength and tone is noted. There is no drift, tremor or rebound. Romberg is negative. Reflexes are 2+ throughout. Babinski: Toes are flexor bilaterally. Fine motor skills and coordination: intact with normal finger taps, normal hand movements, normal rapid alternating patting, normal foot taps and normal foot agility.  Cerebellar testing: No dysmetria or intention tremor on finger to nose testing. Heel to shin is unremarkable bilaterally. There is no truncal or gait ataxia.  Sensory exam: intact to light touch, pinprick, vibration, temperature sense in the upper and lower extremities.  Gait, station and balance: He stands easily. No veering to one side is noted. No leaning to one side is noted. Posture is age-appropriate and stance is narrow based. Gait shows normal stride length and normal pace. No problems turning are noted. Tandem walk is difficult initially.                Assessment and Plan:  In summary, Abel I Roedl is a very pleasant 61 y.o.-year old male with an underlying medical history of arthritis, diabetes, hyperlipidemia, hypertension, and overweight state, whose history is in keeping with restless leg syndrome and PLMS. In addition, his history and physical exam are concerning for underlying obstructive sleep apnea given his history of snoring, sleep disruption, significant nocturia, daytime tiredness. He has not had any telltale improvement on Requip. This is currently low dose and there is room to increase it if we need to. I would like to proceed with further blood work including iron studies. We will call with his test results. In addition, he would benefit from overnight sleep study testing to rule out obstructive sleep apnea and to monitor sleep and to look for PLMS. If he has obstructive sleep apnea, he is advised to  consider CPAP therapy. He is strongly advised to decrease his excessive caffeine intake and increase his water intake and to quit smoking. Furthermore, we talked about improving his sleep hygiene. We talked about smoking cessation and trying to maintain a healthy lifestyle in general, as well as the importance of weight control. I encouraged the patient to eat healthy, exercise daily and keep well hydrated, to keep a scheduled bedtime and wake time routine, to not skip any meals and eat healthy snacks in between meals. I advised the patient not to drive when feeling sleepy. I  recommended the following at this time: sleep study with potential positive airway pressure titration.   I explained the sleep test procedure to the patient. I would like to see him back after testing is completed. He is advised to continue with his current medications and we may increase ropinirole down the road. I answered all his questions today and the patient was in agreement. Thank you very much for allowing me to participate in the care of this nice patient. If I can be of any further assistance to you please do not hesitate to call me at [phone].  Sincerely,   Star Age, MD, PhD

## 2016-03-02 NOTE — Patient Instructions (Addendum)
Based on your symptoms and your exam I believe you are at risk for obstructive sleep apnea and you have restless legs syndrome.  We should proceed with a sleep study to determine whether you do or do not have OSA and how severe it is and if we need to modify your restless legs medication.  If you have more than mild OSA, I want you to consider treatment with CPAP. Please remember, the risks and ramifications of moderate to severe obstructive sleep apnea or OSA are: Cardiovascular disease, including congestive heart failure, stroke, difficult to control hypertension, arrhythmias, and even type 2 diabetes has been linked to untreated OSA. Sleep apnea causes disruption of sleep and sleep deprivation in most cases, which, in turn, can cause recurrent headaches, problems with memory, mood, concentration, focus, and vigilance. Most people with untreated sleep apnea report excessive daytime sleepiness, which can affect their ability to drive. Please do not drive if you feel sleepy.   We will check blood work today and call you with the test results.  I will likely see you back after your sleep study to go over the test results and where to go from there. We will call you after your sleep study to advise about the results (most likely, you will hear from Beverlee Nims, my nurse) and to set up an appointment at the time, as necessary.    Please remember to try to maintain good sleep hygiene, which means: Keep a regular sleep and wake schedule, try not to exercise or have a meal within 2 hours of your bedtime, try to keep your bedroom conducive for sleep, that is, cool and dark, without light distractors such as an illuminated alarm clock, and refrain from watching TV right before sleep or in the middle of the night and do not keep the TV or radio on during the night. Also, try not to use or play on electronic devices at bedtime, such as your cell phone, tablet PC or laptop. If you like to read at bedtime on an electronic  device, try to dim the background light as much as possible. Do not eat in the middle of the night.   Please stop smoking and reduce your coffee intake, drink more water.   Our sleep lab administrative assistant, Arrie Aran will meet with you or call you to schedule your sleep study. If you don't hear back from her by next week please feel free to call her at [phone]. This is her direct line and please leave a message with your phone number to call back if you get the voicemail box. She will call back as soon as possible.

## 2016-03-03 LAB — IRON AND TIBC
Iron Saturation: 25 % (ref 15–55)
Iron: 74 ug/dL (ref 38–169)
TIBC: 302 ug/dL (ref 250–450)
UIBC: 228 ug/dL (ref 111–343)

## 2016-03-03 LAB — FERRITIN: Ferritin: 123 ng/mL (ref 30–400)

## 2016-03-03 LAB — HGB A1C W/O EAG: HEMOGLOBIN A1C: 8.5 % — AB (ref 4.8–5.6)

## 2016-03-03 NOTE — Progress Notes (Signed)
Please call pt: lab results indicate okay iron status, no need for iron supplementation, but A1c, which is a longer term DM marker is elevated, indicating suboptimal diabetes control, would recommend that he discuss with primary care physician optimization of diabetes. He is at risk for diabetic neuropathy, this could in part explain his symptoms of pain in feet but also symptoms in his hands. It is important that his diabetes be better controlled. No further action is required at this time. Star Age, MD, PhD [facility])

## 2016-03-06 ENCOUNTER — Telehealth: Payer: Self-pay

## 2016-03-06 NOTE — Telephone Encounter (Signed)
I spoke to patient and he is aware of results and recommendations.  

## 2016-03-06 NOTE — Telephone Encounter (Signed)
-----   Message from Star Age, MD sent at 03/03/2016 10:10 AM EST ----- Please call pt: lab results indicate okay iron status, no need for iron supplementation, but A1c, which is a longer term DM marker is elevated, indicating suboptimal diabetes control, would recommend that he discuss with primary care physician optimization of diabetes. He is at risk for diabetic neuropathy, this could in part explain his symptoms of pain in feet but also symptoms in his hands. It is important that his diabetes be better controlled. No further action is required at this time. Star Age, MD, PhD [facility])

## 2016-03-20 ENCOUNTER — Ambulatory Visit: Payer: Medicaid Other | Admitting: Neurology

## 2016-10-19 ENCOUNTER — Ambulatory Visit (INDEPENDENT_AMBULATORY_CARE_PROVIDER_SITE_OTHER): Payer: Medicaid Other | Admitting: Neurology

## 2016-10-19 DIAGNOSIS — Z72821 Inadequate sleep hygiene: Secondary | ICD-10-CM

## 2016-10-19 DIAGNOSIS — Z789 Other specified health status: Secondary | ICD-10-CM

## 2016-10-19 DIAGNOSIS — R400 Somnolence: Secondary | ICD-10-CM

## 2016-10-19 DIAGNOSIS — G4761 Periodic limb movement disorder: Secondary | ICD-10-CM

## 2016-10-19 DIAGNOSIS — G4733 Obstructive sleep apnea (adult) (pediatric): Secondary | ICD-10-CM | POA: Diagnosis not present

## 2016-10-19 DIAGNOSIS — G4720 Circadian rhythm sleep disorder, unspecified type: Secondary | ICD-10-CM

## 2016-10-19 DIAGNOSIS — R351 Nocturia: Secondary | ICD-10-CM

## 2016-10-19 DIAGNOSIS — R0683 Snoring: Secondary | ICD-10-CM

## 2016-10-19 DIAGNOSIS — G2581 Restless legs syndrome: Secondary | ICD-10-CM

## 2016-10-23 ENCOUNTER — Telehealth: Payer: Self-pay

## 2016-10-23 NOTE — Progress Notes (Signed)
Patient referred by Dr. Doreene Nest for RLS, seen by me on 03/02/16, diagnostic PSG on 10/19/16.     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 OSA is overall mild (more noticeable in REM sleep), but worth treating to see if he feels better after treatment; ie less restless sleep, better rested in AM, may help his RLS too as a result. To that end I recommend a trial of autoPAP, which means, that we don't have to bring him back for a second sleep study with CPAP, but will let him try an autoPAP machine at home, through a DME company (of his choice, or as per insurance requirement). The DME representative will educate him on how to use the machine, how to put the mask on, etc. I have placed an order in the chart. Please send referral, talk to patient, send report to PCP and referring MD. We will need a FU in sleep clinic for about 10 weeks post-PAP set up, please arrange that as well. Thanks,   Star Age, MD, PhD [facility])

## 2016-10-23 NOTE — Telephone Encounter (Signed)
-----   Message from Star Age, MD sent at 10/23/2016  7:50 AM EDT ----- Patient referred by Dr. Doreene Nest for RLS, seen by me on 03/02/16, diagnostic PSG on 10/19/16.     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 OSA is overall mild (more noticeable in REM sleep), but worth treating to see if he feels better after treatment; ie less restless sleep, better rested in AM, may help his RLS too as a result. To that end I recommend a trial of autoPAP, which means, that we don't have to bring him back for a second sleep study with CPAP, but will let him try an autoPAP machine at home, through a DME company (of his choice, or as per insurance requirement). The DME representative will educate him on how to use the machine, how to put the mask on, etc. I have placed an order in the chart. Please send referral, talk to patient, send report to PCP and referring MD. We will need a FU in sleep clinic for about 10 weeks post-PAP set up, please arrange that as well. Thanks,   Star Age, MD, PhD [facility])

## 2016-10-23 NOTE — Telephone Encounter (Signed)
I called pt. I advised him that Dr. Rexene Alberts reviewed his sleep study and found that his sleep study showed PLMs in keeping with his RLS symptoms, but also mild osa. The mild osa is worth treating to see if he feels better after treatment, such as less restless sleep, feeling better rested in the morning, which may help his RLS too. I explained the auto pap process. Pt is not sure about starting the auto pap. He asked that I send a copy of his sleep study results to Dr. Elmon Kirschner and he will discuss with Dr. Doreene Nest and his wife and let us know how he would like to proceed. I offered pt an appt with Dr. Rexene Alberts but he declined at this time. Pt verbalized understanding of results. Pt had no questions at this time but was encouraged to call back if questions arise.

## 2016-10-23 NOTE — Procedures (Signed)
PATIENT'S NAME:  Adrian Bernard, Holcomb DOB:      05/04/1955      MR#:    [id number]     DATE OF RECORDING: 10/19/2016 REFERRING M.D.:  Suzanna Obey MD Study Performed:   Baseline Polysomnogram HISTORY: 61 year old man with a history of arthritis, diabetes, hyperlipidemia, hypertension, and overweight state, who reports symptoms of restless leg syndrome for the past 2-3 years, worse in the past 1 year. The patient endorsed the Epworth Sleepiness Scale at 8 points. The patient's weight 172 pounds with a height of 67 (inches), resulting in a BMI of 27. kg/m2. The patient's neck circumference measured 15.8 inches.  CURRENT MEDICATIONS: Aspirin, Flexeril, Januvia, Mobic, Metformin, Pravachol, Requip   PROCEDURE:  This is a multichannel digital polysomnogram utilizing the Somnostar 11.2 system.  Electrodes and sensors were applied and monitored per AASM Specifications.   EEG, EOG, Chin and Limb EMG, were sampled at 200 Hz.  ECG, Snore and Nasal Pressure, Thermal Airflow, Respiratory Effort, CPAP Flow and Pressure, Oximetry was sampled at 50 Hz. Digital video and audio were recorded.      BASELINE STUDY  Lights Out was at 22:39 and Lights On at 05:00.  Total recording time (TRT) was 381.5 minutes, with a total sleep time (TST) of  338 minutes.   The patient's sleep latency was 7 minutes.  REM latency was 58.5 minutes.  The sleep efficiency was 88.6 %.     SLEEP ARCHITECTURE: WASO (Wake after sleep onset) was 36.5 minutes with moderate sleep fragmentation noted.  There were 66 minutes in Stage N1, 168.5 minutes Stage N2, 56.5 minutes Stage N3 and 47 minutes in Stage REM.  The percentage of Stage N1 was 19.5%, which is increased, Stage N2 was 49.9%, which is normal, Stage N3 was 16.7%, and Stage R (REM sleep) was 13.9%, which is mildly reduced. The arousals were noted as: 40 were spontaneous, 56 were associated with PLMs, 21 were associated with respiratory events.    Audio and video analysis did not show any  abnormal or unusual movements, behaviors, phonations or vocalizations. The patient took 1 bathroom break. Mild snoring was noted. The EKG was in keeping with normal sinus rhythm (NSR).  RESPIRATORY ANALYSIS:  There were a total of 37 respiratory events:  13 obstructive apneas, 1 central apneas and 0 mixed apneas with a total of 14 apneas and an apnea index (AI) of 2.5 /hour. There were 23 hypopneas with a hypopnea index of 4.1 /hour. The patient also had 3 respiratory event related arousals (RERAs).      The total APNEA/HYPOPNEA INDEX (AHI) was 6.6/hour and the total RESPIRATORY DISTURBANCE INDEX was 7.1 /hour.  17 events occurred in REM sleep and 29 events in NREM. The REM AHI was 21.7 /hour, versus a non-REM AHI of 4.1. The patient spent 202 minutes of total sleep time in the supine position and 136 minutes in non-supine.. The supine AHI was 11.0 versus a non-supine AHI of 0.0.  OXYGEN SATURATION & C02:  The Wake baseline 02 saturation was 95%, with the lowest being 87%. Time spent below 89% saturation equaled 1 minutes.  PERIODIC LIMB MOVEMENTS: The patient had a total of 98 Periodic Limb Movements.  The Periodic Limb Movement (PLM) index was 17.4 and the PLM Arousal index was 9.9/hour.  Post-study, the patient indicated that sleep was the same as usual.   IMPRESSION:  1. Obstructive Sleep Apnea (OSA) 2. Periodic Limb Movement Disorder (PLMD) 3. Dysfunctions associated with sleep stages or arousal from sleep  RECOMMENDATIONS:  1. This study demonstrates overall mild obstructive sleep apnea, moderate (by number of events) in REM sleep with a total AHI of 6.6/hour, REM AHI of 21.7/hour, and O2 nadir of 87%. Due to the overall mild nature of the patient's sleep apnea, there are several therapeutic avenues available. Options include avoidance of supine sleep position along with weight loss, upper airway or jaw surgery in selected patients or the use of an oral appliance in certain patients. ENT  evaluation and/or consultation with a maxillofacial surgeon or dentist may be feasible and some instances. Therapy with positive airway pressure can be considered, this can be achieved in the form of a full night CPAP titration versus trial of AutoPAP at home. Given his sleep related complaints, a trial of autoPAP therapy is feasible.  2. Mild PLMs (periodic limb movements of sleep) were noted during this study with mild arousals; clinical correlation is recommended.  3. This study shows sleep fragmentation and abnormal sleep stage percentages; these are nonspecific findings and per se do not signify an intrinsic sleep disorder or a cause for the patient's sleep-related symptoms. Causes include (but are not limited to) the first night effect of the sleep study, circadian rhythm disturbances, medication effect or an underlying mood disorder or medical problem.  4. The patient should be cautioned not to drive, work at heights, or operate dangerous or heavy equipment when tired or sleepy. Review and reiteration of good sleep hygiene measures should be pursued with any patient. 5. The patient will be seen in follow-up by Dr. Rexene Alberts at [facility] for discussion of the test results and further management strategies. The referring provider will be notified of the test results.  I certify that I have reviewed the entire raw data recording prior to the issuance of this report in accordance with the Standards of Accreditation of the American Academy of Sleep Medicine (AASM)    Star Age, MD, PhD Diplomat, American Board of Psychiatry and Neurology (Neurology and Sleep Medicine)

## 2016-10-23 NOTE — Addendum Note (Signed)
Addended by: Star Age on: 10/23/2016 07:50 AM   Modules accepted: Orders

## 2016-11-13 MED ORDER — ROPINIROLE HCL 0.25 MG PO TABS: ORAL_TABLET | ORAL | 5 refills | Status: DC

## 2016-11-13 NOTE — Addendum Note (Signed)
Addended by: Star Age on: 11/13/2016 01:30 PM   Modules accepted: Orders

## 2016-11-13 NOTE — Telephone Encounter (Signed)
Pt came by the office. He wants to start the auto pap but is going out of the country for the next 6 months. I explained to him that his insurance (medicaid) requires strict follow up and compliance for him to keep his machine; if he will not be able to follow up with [place] 30-90 days post auto pap start, Medicaid will make him return his machine. Pt wants to know if there is another medication that he can try other than the requip 0.25mg  that he already takes qhs. He reports that the requip does help when he takes it. I again explained to him that changing/adding medications to his regimen may not be the best idea before he goes out of the country for 6 months, since it will be hard to monitor his response/reactions to new medications. Pt is asking that I still ask if Dr. Rexene Alberts is willing to add any medications for him to take for his RLS at this time. If so, I will call the pt back. Pt asked for a copy of his sleep study, which I provided to him as well. Pt will call me back when he returns to the country if he still wants to pursue auto-pap.

## 2016-11-13 NOTE — Telephone Encounter (Signed)
I called pt, advised him of the increase in requip and the instructions. Pt is agreeable to this. An appt was made for 06/27/16 at 3:00pm. Pt verbalized understanding of recommendations and new appt date and time.

## 2016-11-13 NOTE — Telephone Encounter (Signed)
He can increase the Requip to 2 pills = 0.5 mg in the evening, take about 2 hours before bedtime. If needed, he can increase to 3 pills, ie 0.75 mg after a couple of weeks. FU appt should be scheduled.  Pls call him back to advise, I will adjust Rx.

## 2017-06-27 ENCOUNTER — Ambulatory Visit: Payer: Self-pay | Admitting: Neurology

## 2017-06-28 ENCOUNTER — Encounter: Payer: Self-pay | Admitting: Neurology

## 2018-04-03 ENCOUNTER — Encounter (HOSPITAL_BASED_OUTPATIENT_CLINIC_OR_DEPARTMENT_OTHER): Payer: Self-pay

## 2018-04-03 ENCOUNTER — Emergency Department (HOSPITAL_BASED_OUTPATIENT_CLINIC_OR_DEPARTMENT_OTHER)
Admission: EM | Admit: 2018-04-03 | Discharge: 2018-04-03 | Disposition: A | Discharge status: Home / Self Care | Payer: Medicaid Other | Attending: Emergency Medicine | Admitting: Emergency Medicine

## 2018-04-03 ENCOUNTER — Other Ambulatory Visit: Payer: Self-pay

## 2018-04-03 ENCOUNTER — Emergency Department (HOSPITAL_BASED_OUTPATIENT_CLINIC_OR_DEPARTMENT_OTHER): Payer: Medicaid Other

## 2018-04-03 DIAGNOSIS — Z7982 Long term (current) use of aspirin: Secondary | ICD-10-CM | POA: Insufficient documentation

## 2018-04-03 DIAGNOSIS — E119 Type 2 diabetes mellitus without complications: Secondary | ICD-10-CM | POA: Insufficient documentation

## 2018-04-03 DIAGNOSIS — J40 Bronchitis, not specified as acute or chronic: Secondary | ICD-10-CM | POA: Insufficient documentation

## 2018-04-03 DIAGNOSIS — B9789 Other viral agents as the cause of diseases classified elsewhere: Secondary | ICD-10-CM

## 2018-04-03 DIAGNOSIS — I10 Essential (primary) hypertension: Secondary | ICD-10-CM | POA: Insufficient documentation

## 2018-04-03 DIAGNOSIS — J069 Acute upper respiratory infection, unspecified: Secondary | ICD-10-CM | POA: Insufficient documentation

## 2018-04-03 DIAGNOSIS — Z7984 Long term (current) use of oral hypoglycemic drugs: Secondary | ICD-10-CM | POA: Diagnosis not present

## 2018-04-03 DIAGNOSIS — R05 Cough: Secondary | ICD-10-CM | POA: Diagnosis present

## 2018-04-03 DIAGNOSIS — F17210 Nicotine dependence, cigarettes, uncomplicated: Secondary | ICD-10-CM | POA: Diagnosis not present

## 2018-04-03 DIAGNOSIS — B974 Respiratory syncytial virus as the cause of diseases classified elsewhere: Secondary | ICD-10-CM | POA: Diagnosis not present

## 2018-04-03 HISTORY — DX: Essential (primary) hypertension: I10

## 2018-04-03 LAB — CBC WITH DIFFERENTIAL/PLATELET
Abs Immature Granulocytes: 0.04 10*3/uL (ref 0.00–0.07)
Basophils Absolute: 0.1 10*3/uL (ref 0.0–0.1)
Basophils Relative: 1 %
Eosinophils Absolute: 0.7 10*3/uL — ABNORMAL HIGH (ref 0.0–0.5)
Eosinophils Relative: 7 %
HEMATOCRIT: 44 % (ref 39.0–52.0)
Hemoglobin: 13.9 g/dL (ref 13.0–17.0)
Immature Granulocytes: 0 %
LYMPHS ABS: 3.4 10*3/uL (ref 0.7–4.0)
LYMPHS PCT: 35 %
MCH: 28.3 pg (ref 26.0–34.0)
MCHC: 31.6 g/dL (ref 30.0–36.0)
MCV: 89.6 fL (ref 80.0–100.0)
MONOS PCT: 9 %
Monocytes Absolute: 0.9 10*3/uL (ref 0.1–1.0)
Neutro Abs: 4.6 10*3/uL (ref 1.7–7.7)
Neutrophils Relative %: 48 %
Platelets: 290 10*3/uL (ref 150–400)
RBC: 4.91 MIL/uL (ref 4.22–5.81)
RDW: 13 % (ref 11.5–15.5)
WBC: 9.7 10*3/uL (ref 4.0–10.5)
nRBC: 0 % (ref 0.0–0.2)

## 2018-04-03 LAB — BASIC METABOLIC PANEL
Anion gap: 8 (ref 5–15)
BUN: 16 mg/dL (ref 8–23)
CO2: 22 mmol/L (ref 22–32)
Calcium: 9.1 mg/dL (ref 8.9–10.3)
Chloride: 105 mmol/L (ref 98–111)
Creatinine, Ser: 0.79 mg/dL (ref 0.61–1.24)
GFR calc Af Amer: >60 mL/min (ref >60)
GFR calc non Af Amer: >60 mL/min (ref >60)
Glucose, Bld: 223 mg/dL — ABNORMAL HIGH (ref 70–99)
Potassium: 4.2 mmol/L (ref 3.5–5.1)
Sodium: 135 mmol/L (ref 135–145)

## 2018-04-03 MED ORDER — ALBUTEROL SULFATE (2.5 MG/3ML) 0.083% IN NEBU
INHALATION_SOLUTION | RESPIRATORY_TRACT | Status: AC
  Administered 2018-04-03: 5 mg
  Filled 2018-04-03: qty 6

## 2018-04-03 MED ORDER — ALBUTEROL SULFATE (2.5 MG/3ML) 0.083% IN NEBU
5.0000 mg | INHALATION_SOLUTION | Freq: Once | RESPIRATORY_TRACT | Status: AC
  Administered 2018-04-03: 5 mg via RESPIRATORY_TRACT

## 2018-04-03 MED ORDER — BENZONATATE 100 MG PO CAPS: 100.0000 mg | ORAL_CAPSULE | Freq: Three times a day (TID) | ORAL | 0 refills | Status: DC | PRN

## 2018-04-03 MED ORDER — ALBUTEROL SULFATE HFA 108 (90 BASE) MCG/ACT IN AERS
2.0000 | INHALATION_SPRAY | Freq: Once | RESPIRATORY_TRACT | Status: AC
  Administered 2018-04-03: 2 via RESPIRATORY_TRACT
  Filled 2018-04-03: qty 6.7

## 2018-04-03 NOTE — Progress Notes (Signed)
ID PROGRESS NOTE  Mr Adrian Bernard is a 63yo M with DM, HTN, HLD who recently travelled to [place], [place] to visit family for 21day trip. He states that roughly 10 days ago he started to have fever, and cough. He was diagnosed clinically with flu like illness and given an abtx injection. He states that his fever improved, (has been afebrile for the past 7 d)  But has had ongoing dry cough. He denies any sick contacts or known contact for covid-19.   He flew back on march 2nd thru [place] (4hr layover) then onto [employer] back to   Since his cough is not improved, he came to [facility] ED for evaluation.  BP 115/64 (BP Location: Left Arm)   Pulse 62   Temp 98.5 F (36.9 C) (Rectal)   Resp 20   Ht 5\' 7"  (1.702 m)   Wt 77.1 kg   SpO2 96%   BMI 26.63 kg/m  Provider mentioned that they do not hear any pulm symptoms  I have ran his case by the [facility] health department who states that he does not fit the current definition for COVID-19 screening.  I have asked provider to check RVP,including flu plus cxr. Would treat symptoms with cough suppression. If CXR shows infitlrate would favor to treat as secondary bacterial pneumonia.  Please give mask for him to use after he is discharged from ed. Recommend to rest and home and consider self quarantine at home for next 14 days

## 2018-04-03 NOTE — ED Triage Notes (Signed)
Pt c/o cough and congestion for ten days, just got back from [place] where he flew through [place], apparently was dx'd with the flu there and states the cough is still lingering, no fevers for at least a week

## 2018-04-03 NOTE — ED Provider Notes (Signed)
[facility] EMERGENCY DEPARTMENT Provider Note   CSN: [id number] Arrival date & time: 04/03/18  1702    History   Chief Complaint Chief Complaint  Patient presents with  . Cough    HPI Adrian Bernard is a 63 y.o. male with history of diabetes, hypertension high cholesterol presenting today for cough.  Patient reports that he recently returned to the [place] from [place].  Patient states that 10 days ago he developed a fever and cough.  He states that while in [place] he was diagnosed with an unknown virus and treated with an unknown antibiotic injection.  Patient states that that his symptoms have improved.  Patient states that he has been afebrile for the past 8 days.  Patient presents today for lingering cough.  He states that his cough is mostly dry occasionally productive with light yellow sputum.  Patient has been taking ibuprofen for his cough with minimal relief, last dose 400 mg ibuprofen around lunchtime today.  Patient denies any known recent sick contacts.  Patient's son is at bedside denies recent illness. ------------- Upon arrival patient was placed on contact precautions by nursing staff.  Full CAPR was used during my encounter with the patient.     HPI  Past Medical History:  Diagnosis Date  . Diabetes mellitus without complication ([place])   . High cholesterol   . Hypertension     Patient Active Problem List   Diagnosis Date Noted  . SPASM, MUSCLE 02/08/2009  . DRY COUGH 02/08/2009  . CHEST PAIN, ATYPICAL 02/08/2009  . EXTERNAL HEMORRHOIDS 06/23/2008  . ECZEMA 06/23/2008  . WEIGHT LOSS, RECENT 05/22/2008  . DIABETES-TYPE 2 01/01/2007  . HYPERCHOLESTEROLEMIA 03/29/2006  . TOBACCO DEPENDENCE 03/29/2006  . BACK PAIN, LOW 03/29/2006  . CHEST PAIN 03/29/2006    Past Surgical History:  Procedure Laterality Date  . dental implants          Home Medications    Prior to Admission medications   Medication Sig Start Date End Date Taking?  Authorizing Provider  aspirin (ASPIR-81) 81 MG EC tablet Take 81 mg by mouth daily.      [provider]  JANUVIA 100 MG tablet  02/23/16   [provider]  metFORMIN (GLUCOPHAGE) 1000 MG tablet Take 1,000 mg by mouth 2 (two) times daily. Take 1 tab in the morning and 1 tab in the evening     [provider]  pravastatin (PRAVACHOL) 80 MG tablet Take by mouth. 10/13/15 10/12/16  [provider]  rOPINIRole (REQUIP) 0.25 MG tablet Take 2 pills each evening (2 hours before bedtime). May increase to 3 pills each evening after 2 weeks. 11/13/16   Star Age, MD    Family History Family History  Problem Relation Age of Onset  . Heart disease Father   . Diabetes Brother   . Heart disease Brother   . Diabetes Brother   . Heart disease Brother   . Colon cancer Neg Hx     Social History Social History   Tobacco Use  . Smoking status: Current Every Day Smoker    Packs/day: 1.00    Types: Cigarettes  . Smokeless tobacco: Never Used  Substance Use Topics  . Alcohol use: No  . Drug use: No     Allergies   Patient has no known allergies.   Review of Systems Review of Systems  Constitutional: Positive for fatigue (Generalized fatigue). Negative for chills and fever.       No fever  past 8 days  Respiratory: Positive for cough. Negative for shortness of breath.   Cardiovascular: Negative.  Negative for chest pain and leg swelling.  Gastrointestinal: Negative.  Negative for abdominal pain, nausea and vomiting.  All other systems reviewed and are negative.    Physical Exam Updated Vital Signs BP 115/64 (BP Location: Left Arm)   Pulse 62   Temp 98.5 F (36.9 C) (Rectal)   Resp 20   Ht 5\' 7"  (1.702 m)   Wt 77.1 kg   SpO2 97%   BMI 26.63 kg/m   Physical Exam Constitutional:      General: He is not in acute distress.    Appearance: Normal appearance. He is well-developed. He is not ill-appearing or diaphoretic.  HENT:     Head:  Normocephalic and atraumatic.     Right Ear: Tympanic membrane, ear canal and external ear normal.     Left Ear: Tympanic membrane, ear canal and external ear normal.     Nose: Nose normal.     Mouth/Throat:     Mouth: Mucous membranes are moist.     Pharynx: Oropharynx is clear.  Eyes:     General: Vision grossly intact. Gaze aligned appropriately.     Extraocular Movements: Extraocular movements intact.     Conjunctiva/sclera: Conjunctivae normal.     Pupils: Pupils are equal, round, and reactive to light.  Neck:     Musculoskeletal: Full passive range of motion without pain, normal range of motion and neck supple.     Trachea: Trachea and phonation normal. No tracheal deviation.     Meningeal: Brudzinski's sign absent.  Cardiovascular:     Rate and Rhythm: Normal rate and regular rhythm.     Pulses: Normal pulses.          Dorsalis pedis pulses are 2+ on the right side and 2+ on the left side.       Posterior tibial pulses are 2+ on the right side and 2+ on the left side.     Heart sounds: Normal heart sounds.  Pulmonary:     Effort: Pulmonary effort is normal. No accessory muscle usage or respiratory distress.     Breath sounds: Normal breath sounds and air entry. No wheezing or rhonchi.     Comments: Patient with hacking nonproductive cough intermittent. Chest:     Chest wall: No tenderness.  Abdominal:     General: Bowel sounds are normal. There is no distension.     Palpations: Abdomen is soft.     Tenderness: There is no abdominal tenderness. There is no guarding or rebound.  Musculoskeletal: Normal range of motion.        General: No tenderness.     Right lower leg: Normal. He exhibits no tenderness. No edema.     Left lower leg: Normal. He exhibits no tenderness. No edema.  Feet:     Right foot:     Protective Sensation: 3 sites tested. 3 sites sensed.     Left foot:     Protective Sensation: 3 sites tested. 3 sites sensed.  Skin:    General: Skin is warm and dry.       Capillary Refill: Capillary refill takes less than 2 seconds.  Neurological:     Mental Status: He is alert.     GCS: GCS eye subscore is 4. GCS verbal subscore is 5. GCS motor subscore is 6.     Comments: Speech is clear and goal oriented, follows commands Major Cranial  nerves without deficit, no facial droop Moves extremities without ataxia, coordination intact Normal gait  Psychiatric:        Mood and Affect: Mood normal.        Behavior: Behavior normal.      ED Treatments / Results  Labs (all labs ordered are listed, but only abnormal results are displayed) Labs Reviewed  CBC WITH DIFFERENTIAL/PLATELET - Abnormal; Notable for the following components:      Result Value   Eosinophils Absolute 0.7 (*)    All other components within normal limits  BASIC METABOLIC PANEL - Abnormal; Notable for the following components:   Glucose, Bld 223 (*)    All other components within normal limits  RESPIRATORY PANEL BY PCR    EKG None  Radiology No results found.  Procedures Procedures (including critical care time)  Medications Ordered in ED Medications  albuterol (PROVENTIL) (2.5 MG/3ML) 0.083% nebulizer solution 5 mg (5 mg Nebulization Given 04/03/18 2006)  albuterol (PROVENTIL) (2.5 MG/3ML) 0.083% nebulizer solution (5 mg  Given 04/03/18 2006)     Initial Impression / Assessment and Plan / ED Course  I have reviewed the triage vital signs and the nursing notes.  Pertinent labs & imaging results that were available during my care of the patient were reviewed by me and considered in my medical decision making (see chart for details).  Clinical Course as of Apr 02 2104  Wed Apr 03, 2018  1815 [id number]   [BM]  1850 Cough Med; await CXR +- CAP coverage; give masks for cough; no required self isolation. Dr. Baxter Flattery.   [BM]    Clinical Course User Index [BM] Deliah Boston, PA-C      63 year old male arrives for continued cough.  Recently returned from the [place].  10 days ago he endorses onset of cough with fever, he was diagnosed with unknown viral illness at that time and treated with an unknown medicine.  Patient states that his fever resolved after 2 days and he has been afebrile for the past 8 days.  Patient states that he has had a intermittent cough since that time that is intermittently productive with light yellow sputum.  He denies hemoptysis, shortness of breath or chest pain.  Patient is overall very well-appearing on arrival and in no acute distress.  Vital signs stable.  Lungs clear to auscultation on my examination. - Discussed case with Dr. Baxter Flattery from infectious disease.  We have discussed patient's history and presentation today.  Recommendations per infectious disease is that if chest x-ray is negative for pneumonia then we will treat with cough medicine, give patient surgical mask to wear in the community and encourage regular outpatient follow-up.  Patient does not require to self isolate.  Low suspicion for novel viruses at this time.  Work-up to include basic blood work, chest x-ray and respiratory panel. - CBC nonacute BMP with elevated glucose Respiratory panel pending Chest x-ray pending - Care handout has been given to Dr. Billy Fischer at shift change.    Note: Portions of this report may have been transcribed using voice recognition software. Every effort was made to ensure accuracy; however, inadvertent computerized transcription errors may still be present. Final Clinical Impressions(s) / ED Diagnoses   Final diagnoses:  None    ED Discharge Orders    None       Gari Crown 04/03/18 2107    Gareth Morgan, MD 04/05/18 1453

## 2018-04-03 NOTE — ED Triage Notes (Signed)
ID called back and has put in a call for their director to call back, for [facility]

## 2018-04-03 NOTE — ED Notes (Signed)
Spoke to Dr Baxter Flattery from infectious disease. She will call back with further instructions about what type of precaution will be needed taking care of the patient.

## 2018-04-03 NOTE — ED Triage Notes (Signed)
Attempted to reach ID at both numbers, left message

## 2018-04-04 LAB — RESPIRATORY PANEL BY PCR
Adenovirus: NOT DETECTED
Bordetella pertussis: NOT DETECTED
Chlamydophila pneumoniae: NOT DETECTED
Coronavirus 229E: NOT DETECTED
Coronavirus HKU1: NOT DETECTED
Coronavirus NL63: NOT DETECTED
Coronavirus OC43: NOT DETECTED
Influenza A: NOT DETECTED
Influenza B: NOT DETECTED
Metapneumovirus: NOT DETECTED
Mycoplasma pneumoniae: NOT DETECTED
PARAINFLUENZA VIRUS 3-RVPPCR: NOT DETECTED
PARAINFLUENZA VIRUS 4-RVPPCR: NOT DETECTED
Parainfluenza Virus 1: NOT DETECTED
Parainfluenza Virus 2: NOT DETECTED
Respiratory Syncytial Virus: NOT DETECTED
Rhinovirus / Enterovirus: DETECTED — AB

## 2018-09-25 ENCOUNTER — Encounter (HOSPITAL_COMMUNITY): Payer: Self-pay

## 2018-09-25 ENCOUNTER — Emergency Department (HOSPITAL_COMMUNITY)
Admission: EM | Admit: 2018-09-25 | Discharge: 2018-09-25 | Disposition: A | Discharge status: Home / Self Care | Payer: Medicaid Other | Attending: Emergency Medicine | Admitting: Emergency Medicine

## 2018-09-25 ENCOUNTER — Other Ambulatory Visit: Payer: Self-pay

## 2018-09-25 ENCOUNTER — Emergency Department (HOSPITAL_COMMUNITY): Payer: Medicaid Other

## 2018-09-25 DIAGNOSIS — Z7982 Long term (current) use of aspirin: Secondary | ICD-10-CM | POA: Insufficient documentation

## 2018-09-25 DIAGNOSIS — I10 Essential (primary) hypertension: Secondary | ICD-10-CM | POA: Insufficient documentation

## 2018-09-25 DIAGNOSIS — X500XXA Overexertion from strenuous movement or load, initial encounter: Secondary | ICD-10-CM | POA: Diagnosis not present

## 2018-09-25 DIAGNOSIS — E876 Hypokalemia: Secondary | ICD-10-CM | POA: Diagnosis not present

## 2018-09-25 DIAGNOSIS — Y929 Unspecified place or not applicable: Secondary | ICD-10-CM | POA: Diagnosis not present

## 2018-09-25 DIAGNOSIS — E119 Type 2 diabetes mellitus without complications: Secondary | ICD-10-CM | POA: Diagnosis not present

## 2018-09-25 DIAGNOSIS — Y9389 Activity, other specified: Secondary | ICD-10-CM | POA: Insufficient documentation

## 2018-09-25 DIAGNOSIS — R079 Chest pain, unspecified: Secondary | ICD-10-CM | POA: Diagnosis present

## 2018-09-25 DIAGNOSIS — R0789 Other chest pain: Secondary | ICD-10-CM

## 2018-09-25 DIAGNOSIS — Z23 Encounter for immunization: Secondary | ICD-10-CM | POA: Diagnosis not present

## 2018-09-25 DIAGNOSIS — Z7984 Long term (current) use of oral hypoglycemic drugs: Secondary | ICD-10-CM | POA: Diagnosis not present

## 2018-09-25 DIAGNOSIS — F17210 Nicotine dependence, cigarettes, uncomplicated: Secondary | ICD-10-CM | POA: Diagnosis not present

## 2018-09-25 DIAGNOSIS — Y999 Unspecified external cause status: Secondary | ICD-10-CM | POA: Diagnosis not present

## 2018-09-25 DIAGNOSIS — T07XXXA Unspecified multiple injuries, initial encounter: Secondary | ICD-10-CM | POA: Diagnosis not present

## 2018-09-25 DIAGNOSIS — R52 Pain, unspecified: Secondary | ICD-10-CM

## 2018-09-25 LAB — BASIC METABOLIC PANEL
Anion gap: 12 (ref 5–15)
BUN: 28 mg/dL — ABNORMAL HIGH (ref 8–23)
CO2: 22 mmol/L (ref 22–32)
Calcium: 9.2 mg/dL (ref 8.9–10.3)
Chloride: 105 mmol/L (ref 98–111)
Creatinine, Ser: 0.83 mg/dL (ref 0.61–1.24)
GFR calc Af Amer: >60 mL/min (ref >60)
GFR calc non Af Amer: >60 mL/min (ref >60)
Glucose, Bld: 179 mg/dL — ABNORMAL HIGH (ref 70–99)
Potassium: 3.1 mmol/L — ABNORMAL LOW (ref 3.5–5.1)
Sodium: 139 mmol/L (ref 135–145)

## 2018-09-25 LAB — CBC
HCT: 41.4 % (ref 39.0–52.0)
Hemoglobin: 13.1 g/dL (ref 13.0–17.0)
MCH: 28.9 pg (ref 26.0–34.0)
MCHC: 31.6 g/dL (ref 30.0–36.0)
MCV: 91.4 fL (ref 80.0–100.0)
Platelets: 245 10*3/uL (ref 150–400)
RBC: 4.53 MIL/uL (ref 4.22–5.81)
RDW: 13.7 % (ref 11.5–15.5)
WBC: 9.4 10*3/uL (ref 4.0–10.5)
nRBC: 0 % (ref 0.0–0.2)

## 2018-09-25 LAB — TROPONIN I (HIGH SENSITIVITY)
Troponin I (High Sensitivity): 3 ng/L (ref <18)
Troponin I (High Sensitivity): 4 ng/L (ref <18)

## 2018-09-25 MED ORDER — TETANUS-DIPHTH-ACELL PERTUSSIS 5-2.5-18.5 LF-MCG/0.5 IM SUSP
0.5000 mL | Freq: Once | INTRAMUSCULAR | Status: AC
  Administered 2018-09-25: 18:00:00 0.5 mL via INTRAMUSCULAR
  Filled 2018-09-25: qty 0.5

## 2018-09-25 MED ORDER — IBUPROFEN 800 MG PO TABS
800.0000 mg | ORAL_TABLET | Freq: Once | ORAL | Status: AC
  Administered 2018-09-25: 800 mg via ORAL
  Filled 2018-09-25: qty 1

## 2018-09-25 MED ORDER — SODIUM CHLORIDE 0.9% FLUSH: 3.0000 mL | Freq: Once | INTRAVENOUS | Status: DC

## 2018-09-25 MED ORDER — CEPHALEXIN 500 MG PO CAPS: 500.0000 mg | ORAL_CAPSULE | Freq: Two times a day (BID) | ORAL | 0 refills | Status: AC

## 2018-09-25 MED ORDER — BACITRACIN ZINC 500 UNIT/GM EX OINT: 1.0000 "application " | TOPICAL_OINTMENT | Freq: Two times a day (BID) | CUTANEOUS | 0 refills | Status: AC

## 2018-09-25 MED ORDER — POTASSIUM CHLORIDE ER 10 MEQ PO TBCR: 20.0000 meq | EXTENDED_RELEASE_TABLET | Freq: Every day | ORAL | 0 refills | Status: DC

## 2018-09-25 MED ORDER — IBUPROFEN 400 MG PO TABS: 400.0000 mg | ORAL_TABLET | Freq: Four times a day (QID) | ORAL | 0 refills | Status: AC | PRN

## 2018-09-25 NOTE — Discharge Instructions (Signed)
Please take medications as prescribed.  Please follow up with your primary care provider within 5-7 days for re-evaluation of your symptoms. If you do not have a primary care provider, information for a healthcare clinic has been provided for you to make arrangements for follow up care. Please return to the emergency department for any new or worsening symptoms including persistent/worsening chest pain, shortness of breath, fevers, signs of infection to your legs.

## 2018-09-25 NOTE — ED Notes (Signed)
Patient transported to X-ray 

## 2018-09-25 NOTE — ED Provider Notes (Signed)
[facility] DEPT Provider Note   CSN: [id number] Arrival date & time: 09/25/18  1547     History   Chief Complaint Chief Complaint  Patient presents with  . Chest Pain  . Leg Pain    HPI Adrian Bernard is a 63 y.o. male.     HPI   Pt is a 63 y/o male with a h/o DM, HLD, HTN who presents to the ED today c/o chest pain. Chest pain located to the right lower chest. Pain started 3 days ago. States pain has been constant. It has worsened since onset. Pain feels sharp and like a pressure. Pain is intermittent and mainly occurs with certain positions and movement. Pain is worse with inspiration and cough. It is also worst with palpation. States pain worse with movement and inspiration. He took ibuprofen which improved sxs somewhat but pain persists.   He states he developed a dry cough today as soon as he entered the hospital. He thinks it is because of tha air conditioning.  Denies fevers. He denies SOB or pain with inspiration. Denies associated nausea, vomiting, diaphoresis.   States he was pouring concrete a few days ago which is when his pain started. He thinks that he may have strained himself during this activity. He also c/o that he sustained some scrapes to his legs at this time.   Denies leg pain/swelling, hemoptysis, recent surgery/trauma, recent long travel, hormone use, personal hx of cancer, or hx of DVT/PE. Denies any recent falls/trauma.    He reports early fam hx of heart dz. His brother has had several MI's, earliest at age 39-50.  States he has smoked on cigarette daily for about 25 years.   No known COVID exposures.  Past Medical History:  Diagnosis Date  . Diabetes mellitus without complication ([place])   . High cholesterol   . Hypertension     Patient Active Problem List   Diagnosis Date Noted  . SPASM, MUSCLE 02/08/2009  . DRY COUGH 02/08/2009  . CHEST PAIN, ATYPICAL 02/08/2009  . EXTERNAL HEMORRHOIDS 06/23/2008  . ECZEMA  06/23/2008  . WEIGHT LOSS, RECENT 05/22/2008  . DIABETES-TYPE 2 01/01/2007  . HYPERCHOLESTEROLEMIA 03/29/2006  . TOBACCO DEPENDENCE 03/29/2006  . BACK PAIN, LOW 03/29/2006  . CHEST PAIN 03/29/2006    Past Surgical History:  Procedure Laterality Date  . dental implants          Home Medications    Prior to Admission medications   Medication Sig Start Date End Date Taking? Authorizing Provider  aspirin (ASPIR-81) 81 MG EC tablet Take 81 mg by mouth daily.     Yes [provider]  JANUVIA 100 MG tablet Take 100 mg by mouth daily.  02/23/16  Yes [provider]  metFORMIN (GLUCOPHAGE) 1000 MG tablet Take 1,000 mg by mouth 2 (two) times daily. Take 1 tab in the morning and 1 tab in the evening    Yes [provider]  pravastatin (PRAVACHOL) 80 MG tablet Take 80 mg by mouth daily.  10/13/15 09/25/18 Yes [provider]  rOPINIRole (REQUIP) 0.25 MG tablet Take 2 pills each evening (2 hours before bedtime). May increase to 3 pills each evening after 2 weeks. 11/13/16  Yes Star Age, MD  bacitracin ointment Apply 1 application topically 2 (two) times daily for 7 days. 09/25/18 10/02/18  Jazzlene Huot S, PA-C  benzonatate (TESSALON) 100 MG capsule Take 1 capsule (100 mg total) by mouth 3 (three) times daily as needed for  cough. Patient not taking: Reported on 09/25/2018 04/03/18   Gareth Morgan, MD  cephALEXin (KEFLEX) 500 MG capsule Take 1 capsule (500 mg total) by mouth 2 (two) times daily for 7 days. 09/25/18 10/02/18  Everrett Lacasse S, PA-C  ibuprofen (ADVIL) 400 MG tablet Take 1 tablet (400 mg total) by mouth every 6 (six) hours as needed for up to 4 days. 09/25/18 09/29/18  Macgregor Aeschliman S, PA-C  potassium chloride (K-DUR) 10 MEQ tablet Take 2 tablets (20 mEq total) by mouth daily for 3 days. 09/25/18 09/28/18  Marcelia Petersen S, PA-C    Family History Family History  Problem Relation Age of Onset  . Heart disease Father   . Diabetes Brother   . Heart  disease Brother   . Diabetes Brother   . Heart disease Brother   . Colon cancer Neg Hx     Social History Social History   Tobacco Use  . Smoking status: Current Every Day Smoker    Packs/day: 1.00    Types: Cigarettes  . Smokeless tobacco: Never Used  Substance Use Topics  . Alcohol use: No  . Drug use: No     Allergies   Patient has no known allergies.   Review of Systems Review of Systems  Constitutional: Negative for fever.  HENT: Negative for sore throat.   Eyes: Negative for visual disturbance.  Respiratory: Positive for cough. Negative for shortness of breath.   Cardiovascular: Positive for chest pain (right chest wall). Negative for leg swelling.  Gastrointestinal: Negative for abdominal pain, constipation, diarrhea, nausea and vomiting.  Genitourinary: Negative for dysuria and hematuria.  Musculoskeletal: Negative for back pain.  Skin: Positive for wound.  Neurological: Negative for headaches.  All other systems reviewed and are negative.    Physical Exam Updated Vital Signs BP 112/66 (BP Location: Right Arm)   Pulse 72   Temp 98.5 F (36.9 C) (Oral)   Resp 16   Ht 5\' 8"  (1.727 m)   Wt 74.8 kg   SpO2 100%   BMI 25.09 kg/m   Physical Exam Vitals signs and nursing note reviewed.  Constitutional:      Appearance: He is well-developed.  HENT:     Head: Normocephalic and atraumatic.  Eyes:     Conjunctiva/sclera: Conjunctivae normal.  Neck:     Musculoskeletal: Neck supple.  Cardiovascular:     Rate and Rhythm: Normal rate and regular rhythm.     Heart sounds: No murmur.  Pulmonary:     Effort: Pulmonary effort is normal. No respiratory distress.     Breath sounds: Normal breath sounds. No decreased breath sounds, wheezing or rhonchi.  Chest:     Chest wall: Tenderness (right lower lateral chest wall TTP that reproduces pain) present.  Abdominal:     Palpations: Abdomen is soft.     Tenderness: There is no abdominal tenderness.   Musculoskeletal:     Right lower leg: He exhibits no tenderness. No edema.     Left lower leg: He exhibits no tenderness. No edema.  Skin:    General: Skin is warm and dry.     Comments: Superficial abrasions to bilat shins with mild surrounding erythema and warmth. Scabs present. No purulent drainage.  Neurological:     Mental Status: He is alert.      ED Treatments / Results  Labs (all labs ordered are listed, but only abnormal results are displayed) Labs Reviewed  BASIC METABOLIC PANEL - Abnormal; Notable for the following components:  Result Value   Potassium 3.1 (*)    Glucose, Bld 179 (*)    BUN 28 (*)    All other components within normal limits  CBC  TROPONIN I (HIGH SENSITIVITY)  TROPONIN I (HIGH SENSITIVITY)    EKG EKG Interpretation  Date/Time:  Wednesday September 25 2018 16:03:53 EDT Ventricular Rate:  80 PR Interval:    QRS Duration: 86 QT Interval:  370 QTC Calculation: 427 R Axis:   40 Text Interpretation:  Sinus rhythm Abnormal R-wave progression, early transition No significant change since last tracing Confirmed by Wandra Arthurs [phone]) on 09/25/2018 6:22:05 PM   Radiology Dg Chest 2 View  Result Date: 09/25/2018 CLINICAL DATA:  63 year old male with right-sided chest pain EXAM: CHEST - 2 VIEW COMPARISON:  Prior chest x-ray 06/03/2018 FINDINGS: Mild hyperinflation. Minimal biapical pleuroparenchymal scarring. No focal airspace consolidation, pulmonary edema, pleural effusion or pneumothorax. Cardiac and mediastinal contours are within normal limits. No acute osseous abnormality. IMPRESSION: Stable chest x-ray without evidence of acute cardiopulmonary process. Electronically Signed   By: Jacqulynn Cadet M.D.   On: 09/25/2018 17:12   Dg Ribs Unilateral Right  Result Date: 09/25/2018 CLINICAL DATA:  Anterior chest pain EXAM: RIGHT RIBS - 2 VIEW COMPARISON:  None. FINDINGS: No fracture or other bone lesions are seen involving the ribs. Right lung clear.  No effusion or pneumothorax. Heart is normal size. IMPRESSION: No visible rib fracture.  No acute findings. Electronically Signed   By: Rolm Baptise M.D.   On: 09/25/2018 18:30    Procedures Procedures (including critical care time)  Medications Ordered in ED Medications  Tdap (BOOSTRIX) injection 0.5 mL (0.5 mLs Intramuscular Given 09/25/18 1755)  ibuprofen (ADVIL) tablet 800 mg (800 mg Oral Given 09/25/18 1753)     Initial Impression / Assessment and Plan / ED Course  I have reviewed the triage vital signs and the nursing notes.  Pertinent labs & imaging results that were available during my care of the patient were reviewed by me and considered in my medical decision making (see chart for details).   Final Clinical Impressions(s) / ED Diagnoses   Final diagnoses:  Atypical chest pain  Hypokalemia  Multiple abrasions   63 year old male complaining of right lower chest wall pain that started after pouring concrete.  He feels like he pulled a muscle.  Pain is worse with certain positions, movements and palpation.  He denies any pain with inspiration or shortness of breath.    Labs ordered in triage.  Chest x-ray and EKG ordered. CBC nonacute BMP with mild hypokalemia and elevated BUN, otherwise reassuring. Will advised hydration. Trop is negative, delta trop is negative.   EKG with NSR, no ischemic changes and no changes from prior.  CXR is negative for pneumonia, pneumothorax or obvious rib fracture.   HE feels improved after meds. He has no PE risk risk factors and therefore I have much lower suspicion for PE.  Symptoms atypical for ACS given that they are nonexertional and can be reproduced on palpation and with certain positions.  His symptoms seem much more likely related to MSK cause.  Will give Rx for anti-inflammatory.  He does have superficial wounds to the bilateral lower extremities with some mild surrounding erythema, will start him on Keflex due to concern for  possible development of cellulitis.  Advised to follow with PCP and return to the ER for new or worsening symptoms.  He voiced understanding the plan and reasons to return.  All questions answered.  Patient stable or discharge.  ED Discharge Orders         Ordered    ibuprofen (ADVIL) 400 MG tablet  Every 6 hours PRN     09/25/18 2100    bacitracin ointment  2 times daily     09/25/18 2100    cephALEXin (KEFLEX) 500 MG capsule  2 times daily     09/25/18 2100    potassium chloride (K-DUR) 10 MEQ tablet  Daily     09/25/18 2100           Rodney Booze, PA-C 09/25/18 2102    Drenda Freeze, MD 09/26/18 1520

## 2018-09-25 NOTE — ED Triage Notes (Addendum)
Pt states that he has scrapes on bilateral legs that have gotten worse. Pt states that he is having right sided chest pain x 3 days. Pt states last night was bad and he was unable to move. Pt states they both developed at the same time. Pt also had a new rash on the center of his chest.

## 2020-06-03 HISTORY — PX: OTHER SURGICAL HISTORY: SHX169

## 2020-06-22 ENCOUNTER — Emergency Department (HOSPITAL_BASED_OUTPATIENT_CLINIC_OR_DEPARTMENT_OTHER): Payer: Medicaid Other

## 2020-06-22 ENCOUNTER — Emergency Department (HOSPITAL_BASED_OUTPATIENT_CLINIC_OR_DEPARTMENT_OTHER)
Admission: EM | Admit: 2020-06-22 | Discharge: 2020-06-22 | Disposition: A | Discharge status: Home / Self Care | Payer: Medicaid Other | Attending: Emergency Medicine | Admitting: Emergency Medicine

## 2020-06-22 ENCOUNTER — Encounter (HOSPITAL_BASED_OUTPATIENT_CLINIC_OR_DEPARTMENT_OTHER): Payer: Self-pay

## 2020-06-22 ENCOUNTER — Other Ambulatory Visit: Payer: Self-pay

## 2020-06-22 DIAGNOSIS — Z7982 Long term (current) use of aspirin: Secondary | ICD-10-CM | POA: Insufficient documentation

## 2020-06-22 DIAGNOSIS — Z7984 Long term (current) use of oral hypoglycemic drugs: Secondary | ICD-10-CM | POA: Insufficient documentation

## 2020-06-22 DIAGNOSIS — E119 Type 2 diabetes mellitus without complications: Secondary | ICD-10-CM | POA: Insufficient documentation

## 2020-06-22 DIAGNOSIS — W450XXA Nail entering through skin, initial encounter: Secondary | ICD-10-CM | POA: Insufficient documentation

## 2020-06-22 DIAGNOSIS — I10 Essential (primary) hypertension: Secondary | ICD-10-CM | POA: Insufficient documentation

## 2020-06-22 DIAGNOSIS — S91332A Puncture wound without foreign body, left foot, initial encounter: Secondary | ICD-10-CM | POA: Diagnosis present

## 2020-06-22 DIAGNOSIS — F17210 Nicotine dependence, cigarettes, uncomplicated: Secondary | ICD-10-CM | POA: Insufficient documentation

## 2020-06-22 MED ORDER — CIPROFLOXACIN HCL 500 MG PO TABS
500.0000 mg | ORAL_TABLET | Freq: Once | ORAL | Status: AC
  Administered 2020-06-22: 500 mg via ORAL
  Filled 2020-06-22: qty 1

## 2020-06-22 MED ORDER — CIPROFLOXACIN HCL 500 MG PO TABS: 500.0000 mg | ORAL_TABLET | Freq: Two times a day (BID) | ORAL | 0 refills | Status: DC

## 2020-06-22 NOTE — ED Notes (Signed)
ED Provider at bedside. 

## 2020-06-22 NOTE — ED Provider Notes (Signed)
[facility] Provider Note  CSN: [id number] Arrival date & time: 06/22/20 2112    History Chief Complaint  Patient presents with  . Foot Pain    HPI  Adrian Bernard is a 65 y.o. male with history of DM stepped on a rusty nail earlier today, through his shoe. He initially didn't seek medical care but there has been increased pain and swelling as the day has gone on. He has taken some ibuprofen prior to arrival. Last TDAP in 2020.    Past Medical History:  Diagnosis Date  . Diabetes mellitus without complication ([place])   . High cholesterol   . Hypertension     Past Surgical History:  Procedure Laterality Date  . dental implants    . NECK SURGERY      Family History  Problem Relation Age of Onset  . Heart disease Father   . Diabetes Brother   . Heart disease Brother   . Diabetes Brother   . Heart disease Brother   . Colon cancer Neg Hx     Social History   Tobacco Use  . Smoking status: Current Every Day Smoker    Packs/day: 1.00    Types: Cigarettes  . Smokeless tobacco: Never Used  Substance Use Topics  . Alcohol use: No  . Drug use: No     Home Medications Prior to Admission medications   Medication Sig Start Date End Date Taking? Authorizing Provider  ciprofloxacin (CIPRO) 500 MG tablet Take 1 tablet (500 mg total) by mouth 2 (two) times daily. 06/22/20  Yes Truddie Hidden, MD  aspirin (ASPIR-81) 81 MG EC tablet Take 81 mg by mouth daily.      [provider]  benzonatate (TESSALON) 100 MG capsule Take 1 capsule (100 mg total) by mouth 3 (three) times daily as needed for cough. Patient not taking: Reported on 09/25/2018 04/03/18   Gareth Morgan, MD  JANUVIA 100 MG tablet Take 100 mg by mouth daily.  02/23/16   [provider]  metFORMIN (GLUCOPHAGE) 1000 MG tablet Take 1,000 mg by mouth 2 (two) times daily. Take 1 tab in the morning and 1 tab in the evening     [provider]  potassium  chloride (K-DUR) 10 MEQ tablet Take 2 tablets (20 mEq total) by mouth daily for 3 days. 09/25/18 09/28/18  Couture, Cortni S, PA-C  pravastatin (PRAVACHOL) 80 MG tablet Take 80 mg by mouth daily.  10/13/15 09/25/18  [provider]  rOPINIRole (REQUIP) 0.25 MG tablet Take 2 pills each evening (2 hours before bedtime). May increase to 3 pills each evening after 2 weeks. 11/13/16   Star Age, MD     Allergies    Patient has no known allergies.   Review of Systems   Review of Systems A comprehensive review of systems was completed and negative except as noted in HPI.    Physical Exam BP 132/65 (BP Location: Left Arm)   Pulse 89   Temp 97.8 F (36.6 C) (Oral)   Resp 20   Ht 5\' 8"  (1.727 m)   Wt 72.1 kg   SpO2 96%   BMI 24.18 kg/m   Physical Exam Vitals and nursing note reviewed.  HENT:     Head: Normocephalic.     Nose: Nose normal.  Eyes:     Extraocular Movements: Extraocular movements intact.  Pulmonary:     Effort: Pulmonary effort is normal.  Musculoskeletal:  General: Normal range of motion.     Cervical back: Neck supple.     Comments: Small puncture wound on plantar surface of L foot at the 1st MTP, see photo  Skin:    Findings: No rash (on exposed skin).  Neurological:     Mental Status: He is alert and oriented to person, place, and time.  Psychiatric:        Mood and Affect: Mood normal.        ED Results / Procedures / Treatments   Labs (all labs ordered are listed, but only abnormal results are displayed) Labs Reviewed - No data to display  EKG None   Radiology DG Foot Complete Left  Result Date: 06/22/2020 CLINICAL DATA:  Pt states he stepped on a nail with left foot today. Nail entered foot at the ball of his foot. EXAM: LEFT FOOT - COMPLETE 3+ VIEW COMPARISON:  Xr left foot 10/31/2004 FINDINGS: There is no evidence of fracture or dislocation. No cortical erosion or destruction. There is no evidence of arthropathy or other focal  bone abnormality. Soft tissues are unremarkable. No retained radiopaque foreign body. IMPRESSION: Negative. Electronically Signed   By: Iven Finn M.D.   On: 06/22/2020 22:44    Procedures Procedures  Medications Ordered in the ED Medications  ciprofloxacin (CIPRO) tablet 500 mg (has no administration in time range)     MDM Rules/Calculators/A&P MDM Patient with DM has puncture wound to foot. TDAP is UTD. Will send for xray to eval metallic FB. Plan discharge with Rx for Cipro to cover pseudomonas given puncture through a tennis shoe.  ED Course  I have reviewed the triage vital signs and the nursing notes.  Pertinent labs & imaging results that were available during my care of the patient were reviewed by me and considered in my medical decision making (see chart for details).  Clinical Course as of 06/22/20 2253  Tue Jun 22, 2020  2247 Foot xray neg for FB. Will place in post-op shoe, Rx for Cipro. Recommend close PCP follow up for wound check.  [CS]    Clinical Course User Index [CS] Truddie Hidden, MD    Final Clinical Impression(s) / ED Diagnoses Final diagnoses:  Puncture wound of left foot, initial encounter    Rx / DC Orders ED Discharge Orders         Ordered    ciprofloxacin (CIPRO) 500 MG tablet  2 times daily        06/22/20 2251           Truddie Hidden, MD 06/22/20 2253

## 2020-06-22 NOTE — ED Triage Notes (Addendum)
Pt states he stepped on a nail with left foot ~4pm-NAD-limping gait

## 2020-08-14 ENCOUNTER — Emergency Department (HOSPITAL_BASED_OUTPATIENT_CLINIC_OR_DEPARTMENT_OTHER)
Admission: EM | Admit: 2020-08-14 | Discharge: 2020-08-14 | Disposition: A | Discharge status: Home / Self Care | Payer: Medicaid Other | Attending: Emergency Medicine | Admitting: Emergency Medicine

## 2020-08-14 ENCOUNTER — Emergency Department (HOSPITAL_BASED_OUTPATIENT_CLINIC_OR_DEPARTMENT_OTHER): Payer: Medicaid Other

## 2020-08-14 ENCOUNTER — Other Ambulatory Visit: Payer: Self-pay

## 2020-08-14 ENCOUNTER — Encounter (HOSPITAL_BASED_OUTPATIENT_CLINIC_OR_DEPARTMENT_OTHER): Payer: Self-pay

## 2020-08-14 DIAGNOSIS — M79661 Pain in right lower leg: Secondary | ICD-10-CM | POA: Insufficient documentation

## 2020-08-14 DIAGNOSIS — F17210 Nicotine dependence, cigarettes, uncomplicated: Secondary | ICD-10-CM | POA: Diagnosis not present

## 2020-08-14 DIAGNOSIS — I10 Essential (primary) hypertension: Secondary | ICD-10-CM | POA: Diagnosis not present

## 2020-08-14 DIAGNOSIS — M25551 Pain in right hip: Secondary | ICD-10-CM | POA: Diagnosis not present

## 2020-08-14 DIAGNOSIS — E1169 Type 2 diabetes mellitus with other specified complication: Secondary | ICD-10-CM | POA: Insufficient documentation

## 2020-08-14 DIAGNOSIS — E7800 Pure hypercholesterolemia, unspecified: Secondary | ICD-10-CM | POA: Insufficient documentation

## 2020-08-14 DIAGNOSIS — M79651 Pain in right thigh: Secondary | ICD-10-CM | POA: Diagnosis not present

## 2020-08-14 DIAGNOSIS — Z79899 Other long term (current) drug therapy: Secondary | ICD-10-CM | POA: Diagnosis not present

## 2020-08-14 DIAGNOSIS — M79604 Pain in right leg: Secondary | ICD-10-CM

## 2020-08-14 DIAGNOSIS — Z7982 Long term (current) use of aspirin: Secondary | ICD-10-CM | POA: Diagnosis not present

## 2020-08-14 DIAGNOSIS — Z7984 Long term (current) use of oral hypoglycemic drugs: Secondary | ICD-10-CM | POA: Insufficient documentation

## 2020-08-14 MED ORDER — ACETAMINOPHEN 325 MG PO TABS
650.0000 mg | ORAL_TABLET | Freq: Once | ORAL | Status: AC
  Administered 2020-08-14: 650 mg via ORAL
  Filled 2020-08-14: qty 2

## 2020-08-14 MED ORDER — DICLOFENAC SODIUM 1 % EX GEL: 4.0000 g | Freq: Four times a day (QID) | CUTANEOUS | 0 refills | Status: DC

## 2020-08-14 MED ORDER — METHOCARBAMOL 500 MG PO TABS
500.0000 mg | ORAL_TABLET | Freq: Two times a day (BID) | ORAL | 0 refills | Status: DC
Start: 2020-08-14 — End: 2021-04-20

## 2020-08-14 MED ORDER — METHOCARBAMOL 500 MG PO TABS
500.0000 mg | ORAL_TABLET | Freq: Once | ORAL | Status: AC
  Administered 2020-08-14: 500 mg via ORAL
  Filled 2020-08-14: qty 1

## 2020-08-14 MED ORDER — KETOROLAC TROMETHAMINE 30 MG/ML IJ SOLN
30.0000 mg | INTRAMUSCULAR | Status: AC
  Administered 2020-08-14: 30 mg via INTRAMUSCULAR
  Filled 2020-08-14: qty 1

## 2020-08-14 MED ORDER — METHOCARBAMOL 500 MG PO TABS: 500.0000 mg | ORAL_TABLET | Freq: Two times a day (BID) | ORAL | 0 refills | Status: DC

## 2020-08-14 MED ORDER — HYDROCODONE-ACETAMINOPHEN 5-325 MG PO TABS
1.0000 | ORAL_TABLET | Freq: Once | ORAL | Status: AC
Start: 2020-08-14 — End: 2020-08-14
  Administered 2020-08-14: 1 via ORAL
  Filled 2020-08-14: qty 1

## 2020-08-14 NOTE — ED Notes (Signed)
Pt. To ultrasound via wheelchair.

## 2020-08-14 NOTE — ED Provider Notes (Signed)
[facility] Provider Note   CSN: [id number] Arrival date & time: 08/14/20  1457     History Chief Complaint  Patient presents with   Leg Pain    Adrian Bernard is a 65 y.o. male.  HPI Patient is a 65 year old male with past medical history significant for DM2, HTN, HLD   Patient presented today for right leg pain.  He states that the pain has been ongoing for 3 days denies any trauma or any overuse or new exercises.  He states that the pain seems to come and go seems to be predominantly his right buttocks and radiates down to his knee but also states he has had some right leg pain and swelling.  He states he is taken some ibuprofen with relief.  Has been eating and drinking normally good urine output denies any fevers chills nausea or vomiting.  Denies any chest pain shortness of breath.  No cough congestion no hemoptysis.  Denies any history of DVTs.  No recent surgery or travel.  Denies any other associate symptoms.  Aggravating factors include walking and pushing on his leg.  Mitigating factors include ibuprofen.    Past Medical History:  Diagnosis Date   Diabetes mellitus without complication ([place])    High cholesterol    Hypertension     Patient Active Problem List   Diagnosis Date Noted   SPASM, MUSCLE 02/08/2009   DRY COUGH 02/08/2009   CHEST PAIN, ATYPICAL 02/08/2009   EXTERNAL HEMORRHOIDS 06/23/2008   ECZEMA 06/23/2008   WEIGHT LOSS, RECENT 05/22/2008   DIABETES-TYPE 2 01/01/2007   HYPERCHOLESTEROLEMIA 03/29/2006   TOBACCO DEPENDENCE 03/29/2006   BACK PAIN, LOW 03/29/2006   CHEST PAIN 03/29/2006    Past Surgical History:  Procedure Laterality Date   dental implants     NECK SURGERY         Family History  Problem Relation Age of Onset   Heart disease Father    Diabetes Brother    Heart disease Brother    Diabetes Brother    Heart disease Brother    Colon cancer Neg Hx     Social History   Tobacco Use    Smoking status: Every Day    Packs/day: 1.00    Types: Cigarettes   Smokeless tobacco: Never  Vaping Use   Vaping Use: Never used  Substance Use Topics   Alcohol use: No   Drug use: No    Home Medications Prior to Admission medications   Medication Sig Start Date End Date Taking? Authorizing Provider  diclofenac Sodium (VOLTAREN) 1 % GEL Apply 4 g topically 4 (four) times daily. 08/14/20  Yes Pati Gallo S, PA  aspirin (ASPIR-81) 81 MG EC tablet Take 81 mg by mouth daily.      [provider]  benzonatate (TESSALON) 100 MG capsule Take 1 capsule (100 mg total) by mouth 3 (three) times daily as needed for cough. Patient not taking: Reported on 09/25/2018 04/03/18   Gareth Morgan, MD  ciprofloxacin (CIPRO) 500 MG tablet Take 1 tablet (500 mg total) by mouth 2 (two) times daily. 06/22/20   Truddie Hidden, MD  JANUVIA 100 MG tablet Take 100 mg by mouth daily.  02/23/16   [provider]  metFORMIN (GLUCOPHAGE) 1000 MG tablet Take 1,000 mg by mouth 2 (two) times daily. Take 1 tab in the morning and 1 tab in the evening     [provider]  methocarbamol (ROBAXIN) 500 MG tablet Take  1 tablet (500 mg total) by mouth 2 (two) times daily. 08/14/20   Tedd Sias, PA  potassium chloride (K-DUR) 10 MEQ tablet Take 2 tablets (20 mEq total) by mouth daily for 3 days. 09/25/18 09/28/18  Couture, Cortni S, PA-C  pravastatin (PRAVACHOL) 80 MG tablet Take 80 mg by mouth daily.  10/13/15 09/25/18  [provider]  rOPINIRole (REQUIP) 0.25 MG tablet Take 2 pills each evening (2 hours before bedtime). May increase to 3 pills each evening after 2 weeks. 11/13/16   Star Age, MD    Allergies    Patient has no known allergies.  Review of Systems   Review of Systems  Constitutional:  Negative for chills and fever.  HENT:  Negative for congestion.   Eyes:  Negative for pain.  Respiratory:  Negative for cough and shortness of breath.   Cardiovascular:  Positive for  leg swelling (Right leg). Negative for chest pain.  Gastrointestinal:  Negative for abdominal pain and vomiting.  Genitourinary:  Negative for dysuria.  Musculoskeletal:  Negative for myalgias.       Right leg pain  Skin:  Negative for rash.  Neurological:  Negative for dizziness and headaches.   Physical Exam Updated Vital Signs BP 125/67 (BP Location: Right Arm)   Pulse 82   Temp 98.7 F (37.1 C) (Oral)   Resp 20   Ht 5\' 8"  (1.727 m)   Wt 72.6 kg   SpO2 98%   BMI 24.33 kg/m   Physical Exam Vitals and nursing note reviewed.  Constitutional:      General: He is not in acute distress. HENT:     Head: Normocephalic and atraumatic.     Nose: Nose normal.  Eyes:     General: No scleral icterus. Cardiovascular:     Rate and Rhythm: Normal rate and regular rhythm.     Pulses: Normal pulses.     Heart sounds: Normal heart sounds.  Pulmonary:     Effort: Pulmonary effort is normal. No respiratory distress.     Breath sounds: Normal breath sounds. No wheezing.  Abdominal:     Palpations: Abdomen is soft.     Tenderness: There is no abdominal tenderness.  Musculoskeletal:     Cervical back: Normal range of motion.     Right lower leg: No edema.     Left lower leg: No edema.     Comments: Symmetric right and left lower extremity.  Some mild tenderness to palpation of the right calf, right popliteal fossa, right mid thigh.  There is also muscular tenderness of the right lateral thigh.    No bony tenderness of the right hip knee or ankle.  Skin:    General: Skin is warm and dry.     Capillary Refill: Capillary refill takes less than 2 seconds.  Neurological:     Mental Status: He is alert. Mental status is at baseline.  Psychiatric:        Mood and Affect: Mood normal.        Behavior: Behavior normal.    ED Results / Procedures / Treatments   Labs (all labs ordered are listed, but only abnormal results are displayed) Labs Reviewed - No data to  display  EKG None  Radiology US Venous Img Lower Right (DVT Study)  Result Date: 08/14/2020 CLINICAL DATA:  Right buttock pain radiating to the thigh EXAM: Right LOWER EXTREMITY VENOUS DOPPLER ULTRASOUND TECHNIQUE: Gray-scale sonography with compression, as well as color and duplex ultrasound, were  performed to evaluate the deep venous system(s) from the level of the common femoral vein through the popliteal and proximal calf veins. COMPARISON:  None. FINDINGS: VENOUS Normal compressibility of the common femoral, superficial femoral, and popliteal veins, as well as the visualized calf veins. Visualized portions of profunda femoral vein and great saphenous vein unremarkable. No filling defects to suggest DVT on grayscale or color Doppler imaging. Doppler waveforms show normal direction of venous flow, normal respiratory plasticity and response to augmentation. Limited views of the contralateral common femoral vein are unremarkable. OTHER None. Limitations: none IMPRESSION: Negative. Electronically Signed   By: Donavan Foil M.D.   On: 08/14/2020 17:17    Procedures Procedures   Medications Ordered in ED Medications  HYDROcodone-acetaminophen (NORCO/VICODIN) 5-325 MG per tablet 1 tablet (1 tablet Oral Given 08/14/20 1638)  acetaminophen (TYLENOL) tablet 650 mg (650 mg Oral Given 08/14/20 1638)  methocarbamol (ROBAXIN) tablet 500 mg (500 mg Oral Given 08/14/20 1638)  ketorolac (TORADOL) 30 MG/ML injection 30 mg (30 mg Intramuscular Given 08/14/20 1814)    ED Course  I have reviewed the triage vital signs and the nursing notes.  Pertinent labs & imaging results that were available during my care of the patient were reviewed by me and considered in my medical decision making (see chart for details).    MDM Rules/Calculators/A&P                          Patient is 65 year old male with past medical history detailed in HPI presented today for right leg pain.  Has been eating and drinking normally  good urine output denies any fevers chills nausea or vomiting.  Seems to have some diffuse tenderness of his distal right lower extremity does not seem to have any bony tenderness.  Has full range of motion of the joints here.  No red or swollen joints concerning for septic arthritis.  Bilateral DP PT pulses 3+ and symmetric low suspicion for ischemia.  Sensation intact no trauma.  Low suspicion for fractures given his history.  Will obtain DVT scan to rule out ultrasound given the right calf pain and swelling the states he has however I do not appreciate significant swelling.  Right lower extremity ultrasound negative.  Reevaluated patient he states his symptoms are much improved after analgesia here.  I discussed the most likely cause of his symptoms either piriformis syndrome or some muscular pain.  Less likely think this is sciatic.  He does not have any back pain.  We will treat conservatively at this time.  He will follow-up closely with his primary care provider.  Return precautions given.  Ambulatory at time of discharge.  He will return to the ER for any new or concerning symptoms.  Final Clinical Impression(s) / ED Diagnoses Final diagnoses:  Right leg pain    Rx / DC Orders ED Discharge Orders          Ordered    methocarbamol (ROBAXIN) 500 MG tablet  2 times daily,   Status:  Discontinued        08/14/20 1751    diclofenac Sodium (VOLTAREN) 1 % GEL  4 times daily        08/14/20 1801    methocarbamol (ROBAXIN) 500 MG tablet  2 times daily        08/14/20 1801             Tedd Sias, [place] 08/14/20 1911    Shirlyn Goltz  Hsienta, MD 08/14/20 2326

## 2020-08-14 NOTE — ED Triage Notes (Signed)
Pt with intermittent leg pain x 3 days that radiates from buttock to knee. Pt experiences temporary relief with ibuprofen. Pain is worse after sitting.

## 2020-08-14 NOTE — Discharge Instructions (Addendum)
Your ultrasound was reassuring today.  Please follow-up with your primary care provider.  Please use the muscle relaxer prescribed as well as Tylenol ibuprofen as discussed below.  You may do warm compresses and gentle massage of your butt muscle and your leg muscles.  Stretch and try plenty of water.  Please use Tylenol or ibuprofen for pain.  You may use 600 mg ibuprofen every 6 hours or 1000 mg of Tylenol every 6 hours.  You may choose to alternate between the 2.  This would be most effective.  Not to exceed 4 g of Tylenol within 24 hours.  Not to exceed 3200 mg ibuprofen 24 hours.

## 2020-12-23 ENCOUNTER — Other Ambulatory Visit: Payer: Self-pay

## 2020-12-23 ENCOUNTER — Emergency Department (HOSPITAL_BASED_OUTPATIENT_CLINIC_OR_DEPARTMENT_OTHER)
Admission: EM | Admit: 2020-12-23 | Discharge: 2020-12-23 | Disposition: A | Discharge status: Home / Self Care | Payer: Medicaid Other | Attending: Emergency Medicine | Admitting: Emergency Medicine

## 2020-12-23 ENCOUNTER — Encounter (HOSPITAL_BASED_OUTPATIENT_CLINIC_OR_DEPARTMENT_OTHER): Payer: Self-pay | Admitting: *Deleted

## 2020-12-23 DIAGNOSIS — E119 Type 2 diabetes mellitus without complications: Secondary | ICD-10-CM | POA: Insufficient documentation

## 2020-12-23 DIAGNOSIS — F17210 Nicotine dependence, cigarettes, uncomplicated: Secondary | ICD-10-CM | POA: Insufficient documentation

## 2020-12-23 DIAGNOSIS — U071 COVID-19: Secondary | ICD-10-CM | POA: Diagnosis not present

## 2020-12-23 DIAGNOSIS — Z7982 Long term (current) use of aspirin: Secondary | ICD-10-CM | POA: Insufficient documentation

## 2020-12-23 DIAGNOSIS — I10 Essential (primary) hypertension: Secondary | ICD-10-CM | POA: Diagnosis not present

## 2020-12-23 DIAGNOSIS — Z7984 Long term (current) use of oral hypoglycemic drugs: Secondary | ICD-10-CM | POA: Diagnosis not present

## 2020-12-23 DIAGNOSIS — R5383 Other fatigue: Secondary | ICD-10-CM | POA: Diagnosis present

## 2020-12-23 LAB — RESP PANEL BY RT-PCR (FLU A&B, COVID) ARPGX2
Influenza A by PCR: NEGATIVE
Influenza B by PCR: NEGATIVE
SARS Coronavirus 2 by RT PCR: POSITIVE — AB

## 2020-12-23 LAB — CBG MONITORING, ED: Glucose-Capillary: 93 mg/dL (ref 70–99)

## 2020-12-23 MED ORDER — ACETAMINOPHEN 325 MG PO TABS
650.0000 mg | ORAL_TABLET | Freq: Once | ORAL | Status: AC
  Administered 2020-12-23: 650 mg via ORAL
  Filled 2020-12-23: qty 2

## 2020-12-23 NOTE — Discharge Instructions (Addendum)
Your COVID-19 test is positive on today's visit.  You will need to isolate for the next 5 days, please wear a mask for the following 5 days.  You may treat your symptoms with any over-the-counter medication that helps with them.  If you experience any chest pain, shortness of breath you will need to return to the emergency department.

## 2020-12-23 NOTE — ED Notes (Signed)
ED Provider at bedside. 

## 2020-12-23 NOTE — ED Triage Notes (Signed)
Fatigue, headache, body aches since last night. Covid exposure.

## 2020-12-23 NOTE — ED Provider Notes (Signed)
[facility] Provider Note   CSN: [id number] Arrival date & time: 12/23/20  1404     History Chief Complaint  Patient presents with   Covid Exposure    Adrian Bernard is a 65 y.o. male.  65 y.o male with a PMH of DM, HTN presents to the ED with a chief complaint of body aches, fatigue x yesterday.  Reports waking up with a headache, this is generalized without any focal point of tenderness.  He does report his sister-in-law currently lives with him, she was recently hospitalized with COVID-19 infection.  Does report his wife has similar symptoms.  On today's visit he feels overall body aches, chills.  He has not taken any medication for improvement in symptoms.  His wife is also in the ED with him, with similar symptoms.  Denies any chest pain, no shortness of breath, no prior history of respiratory disease.    The history is provided by the patient and medical records.      Past Medical History:  Diagnosis Date   Diabetes mellitus without complication ([place])    High cholesterol    Hypertension     Patient Active Problem List   Diagnosis Date Noted   SPASM, MUSCLE 02/08/2009   DRY COUGH 02/08/2009   CHEST PAIN, ATYPICAL 02/08/2009   EXTERNAL HEMORRHOIDS 06/23/2008   ECZEMA 06/23/2008   WEIGHT LOSS, RECENT 05/22/2008   DIABETES-TYPE 2 01/01/2007   HYPERCHOLESTEROLEMIA 03/29/2006   TOBACCO DEPENDENCE 03/29/2006   BACK PAIN, LOW 03/29/2006   CHEST PAIN 03/29/2006    Past Surgical History:  Procedure Laterality Date   dental implants     NECK SURGERY         Family History  Problem Relation Age of Onset   Heart disease Father    Diabetes Brother    Heart disease Brother    Diabetes Brother    Heart disease Brother    Colon cancer Neg Hx     Social History   Tobacco Use   Smoking status: Every Day    Packs/day: 1.00    Types: Cigarettes   Smokeless tobacco: Never  Vaping Use   Vaping Use: Never used  Substance Use  Topics   Alcohol use: No   Drug use: No    Home Medications Prior to Admission medications   Medication Sig Start Date End Date Taking? Authorizing Provider  aspirin (ASPIR-81) 81 MG EC tablet Take 81 mg by mouth daily.      [provider]  benzonatate (TESSALON) 100 MG capsule Take 1 capsule (100 mg total) by mouth 3 (three) times daily as needed for cough. Patient not taking: Reported on 09/25/2018 04/03/18   Gareth Morgan, MD  ciprofloxacin (CIPRO) 500 MG tablet Take 1 tablet (500 mg total) by mouth 2 (two) times daily. 06/22/20   Truddie Hidden, MD  diclofenac Sodium (VOLTAREN) 1 % GEL Apply 4 g topically 4 (four) times daily. 08/14/20   Fondaw, Wylder S, PA  JANUVIA 100 MG tablet Take 100 mg by mouth daily.  02/23/16   [provider]  metFORMIN (GLUCOPHAGE) 1000 MG tablet Take 1,000 mg by mouth 2 (two) times daily. Take 1 tab in the morning and 1 tab in the evening     [provider]  methocarbamol (ROBAXIN) 500 MG tablet Take 1 tablet (500 mg total) by mouth 2 (two) times daily. 08/14/20   Tedd Sias, PA  potassium chloride (K-DUR) 10 MEQ tablet Take 2  tablets (20 mEq total) by mouth daily for 3 days. 09/25/18 09/28/18  Couture, Cortni S, PA-C  pravastatin (PRAVACHOL) 80 MG tablet Take 80 mg by mouth daily.  10/13/15 09/25/18  [provider]  rOPINIRole (REQUIP) 0.25 MG tablet Take 2 pills each evening (2 hours before bedtime). May increase to 3 pills each evening after 2 weeks. 11/13/16   Star Age, MD    Allergies    Patient has no known allergies.  Review of Systems   Review of Systems  Constitutional:  Negative for fever.  HENT:  Negative for sore throat.   Respiratory:  Positive for cough. Negative for shortness of breath.   Cardiovascular:  Negative for chest pain.  Gastrointestinal:  Negative for abdominal pain, nausea and vomiting.  Musculoskeletal:  Positive for myalgias.   Physical Exam Updated Vital Signs BP (!) 145/64    Pulse (!) 108   Temp 98.9 F (37.2 C) (Oral)   Resp 18   Ht 5\' 8"  (1.727 m)   Wt 72.6 kg   SpO2 95%   BMI 24.34 kg/m   Physical Exam Vitals and nursing note reviewed.  Constitutional:      Appearance: Normal appearance.  HENT:     Head: Normocephalic and atraumatic.     Mouth/Throat:     Mouth: Mucous membranes are moist.     Pharynx: Posterior oropharyngeal erythema present. No oropharyngeal exudate.  Cardiovascular:     Rate and Rhythm: Normal rate.  Pulmonary:     Effort: Pulmonary effort is normal.     Breath sounds: No wheezing.     Comments: No wheezing, rhonchi, rales. Abdominal:     General: Abdomen is flat.  Musculoskeletal:     Cervical back: Normal range of motion and neck supple.  Skin:    General: Skin is warm and dry.  Neurological:     Mental Status: He is alert and oriented to person, place, and time.    ED Results / Procedures / Treatments   Labs (all labs ordered are listed, but only abnormal results are displayed) Labs Reviewed  RESP PANEL BY RT-PCR (FLU A&B, COVID) ARPGX2 - Abnormal; Notable for the following components:      Result Value   SARS Coronavirus 2 by RT PCR POSITIVE (*)    All other components within normal limits  CBG MONITORING, ED    EKG None  Radiology No results found.  Procedures Procedures   Medications Ordered in ED Medications  acetaminophen (TYLENOL) tablet 650 mg (650 mg Oral Given 12/23/20 1536)    ED Course  I have reviewed the triage vital signs and the nursing notes.  Pertinent labs & imaging results that were available during my care of the patient were reviewed by me and considered in my medical decision making (see chart for details).  Clinical Course as of 12/23/20 1621  Thu Dec 23, 2020  1619 SARS Coronavirus 2 by RT PCR(!): POSITIVE [JS]    Clinical Course User Index [JS] Janeece Fitting, PA-C   MDM Rules/Calculators/A&P    Patient here with COVID-19 exposure, after his sister-in-law tested  positive for COVID-19 and she currently lives with them.  Reports fatigue, headache, body aches that began yesterday.  His wife also has similar symptoms at this time.  He does report live in the same home.  He has not take any medication for improvement in symptoms.  He is a diabetic, reports his blood sugars have been running within normal limits.  CBG checked in  the ED, then normal limits.  Vitals are within normal limits, he is afebrile, oxygen saturation is 95% without any tachycardia.  He does endorse a history of tobacco use, without any wheezing, rhonchi or rales.  Covid 19 test is positive on today's visit.  I discussed this with patient at this time.  We did discuss return precautions as he does have a history of tobacco use.  He is agreeable of plan and treatment at this time, patient stable for discharge   Portions of this note were generated with Dragon dictation software. Dictation errors may occur despite best attempts at proofreading.  Final Clinical Impression(s) / ED Diagnoses Final diagnoses:  COVID-19 virus infection    Rx / DC Orders ED Discharge Orders     None        Janeece Fitting, PA-C 12/23/20 [place] A, DO 12/23/20 1746

## 2020-12-29 DIAGNOSIS — Z8616 Personal history of COVID-19: Secondary | ICD-10-CM

## 2020-12-29 HISTORY — DX: Personal history of COVID-19: Z86.16

## 2021-03-08 ENCOUNTER — Encounter: Payer: Self-pay | Admitting: Gastroenterology

## 2021-04-11 ENCOUNTER — Encounter: Payer: Self-pay | Admitting: Gastroenterology

## 2021-04-20 ENCOUNTER — Encounter (HOSPITAL_BASED_OUTPATIENT_CLINIC_OR_DEPARTMENT_OTHER): Payer: Self-pay | Admitting: Podiatry

## 2021-04-21 ENCOUNTER — Other Ambulatory Visit: Payer: Self-pay

## 2021-04-21 ENCOUNTER — Encounter (HOSPITAL_BASED_OUTPATIENT_CLINIC_OR_DEPARTMENT_OTHER): Payer: Self-pay | Admitting: Podiatry

## 2021-04-21 NOTE — Progress Notes (Signed)
Spoke w/ via phone for pre-op interview---pt ?Lab needs dos----   I stat , ekg  per anesthesia, surgery orders req dr Fritzi Mandes epic ib         ?Lab results------see below ?COVID test -----patient states asymptomatic no test needed ?Arrive at -------1100 04-25-2021 ?NPO after MN NO Solid Food.  Clear liquids from MN until---1000 am ?Med rec completed ?Medications to take morning of surgery -----Rosuvastatin ?Diabetic medication -----n/a ?Patient instructed no nail polish to be worn day of surgery ?Patient instructed to bring photo id and insurance card day of surgery ?Patient aware to have Driver (ride ) / caregiver son tarrick    for 24 hours after surgery  ?Patient Special Instructions -----pt instructed by dr Fritzi Mandes to stop 81 mg aspirin 2 days before 04-25-2021 surgery ?Pre-Op special Istructions -----No smoking 24 hours before surgery ?Patient verbalized understanding of instructions that were given at this phone interview. ?Patient denies shortness of breath, chest pain, fever, cough at this phone interview.  ? ?H & P /medical clearance/lov dr Maudie Mercury briscoe dated 04-11-2021 on chart/care everywhere for 04-25-2021 surgery ? ?Labs done 04-11-2021 chart/care everywhere: cbc cmet, hemaglobin a1c ?Chest ct 10-05-2020 care everywhere ? ?Called and requested ekg done 05-25-2020 @ [facility], no ekg received, do ekg day of surgery. ? ? ?

## 2021-04-24 ENCOUNTER — Encounter (HOSPITAL_BASED_OUTPATIENT_CLINIC_OR_DEPARTMENT_OTHER): Payer: Self-pay | Admitting: Anesthesiology

## 2021-04-25 ENCOUNTER — Ambulatory Visit (HOSPITAL_BASED_OUTPATIENT_CLINIC_OR_DEPARTMENT_OTHER): Admission: RE | Admit: 2021-04-25 | Payer: Medicaid Other | Source: Home / Self Care | Admitting: Podiatry

## 2021-04-25 HISTORY — DX: Vitamin D deficiency, unspecified: E55.9

## 2021-04-25 HISTORY — DX: Low back pain, unspecified: M54.50

## 2021-04-25 HISTORY — DX: Presence of dental prosthetic device (complete) (partial): Z97.2

## 2021-04-25 HISTORY — DX: Personal history of other diseases of the circulatory system: Z86.79

## 2021-04-25 HISTORY — DX: Unspecified osteoarthritis, unspecified site: M19.90

## 2021-04-25 HISTORY — DX: Gastro-esophageal reflux disease without esophagitis: K21.9

## 2021-04-25 HISTORY — DX: Restless legs syndrome: G25.81

## 2021-04-25 HISTORY — DX: Sleep apnea, unspecified: G47.30

## 2021-04-25 SURGERY — OSTEOTOMY, METATARSAL BONE
Anesthesia: General | Site: Toe | Laterality: Left

## 2021-06-13 IMAGING — DX DG FOOT COMPLETE 3+V*L*
3 series · 3 of 3 positions shown · non-contrast
Comparison: Xr left foot 10/31/2004

CLINICAL DATA: Pt states he stepped on a nail with left foot today.
Nail entered foot at the ball of his foot.

EXAM:
LEFT FOOT - COMPLETE 3+ VIEW

[foot ap]
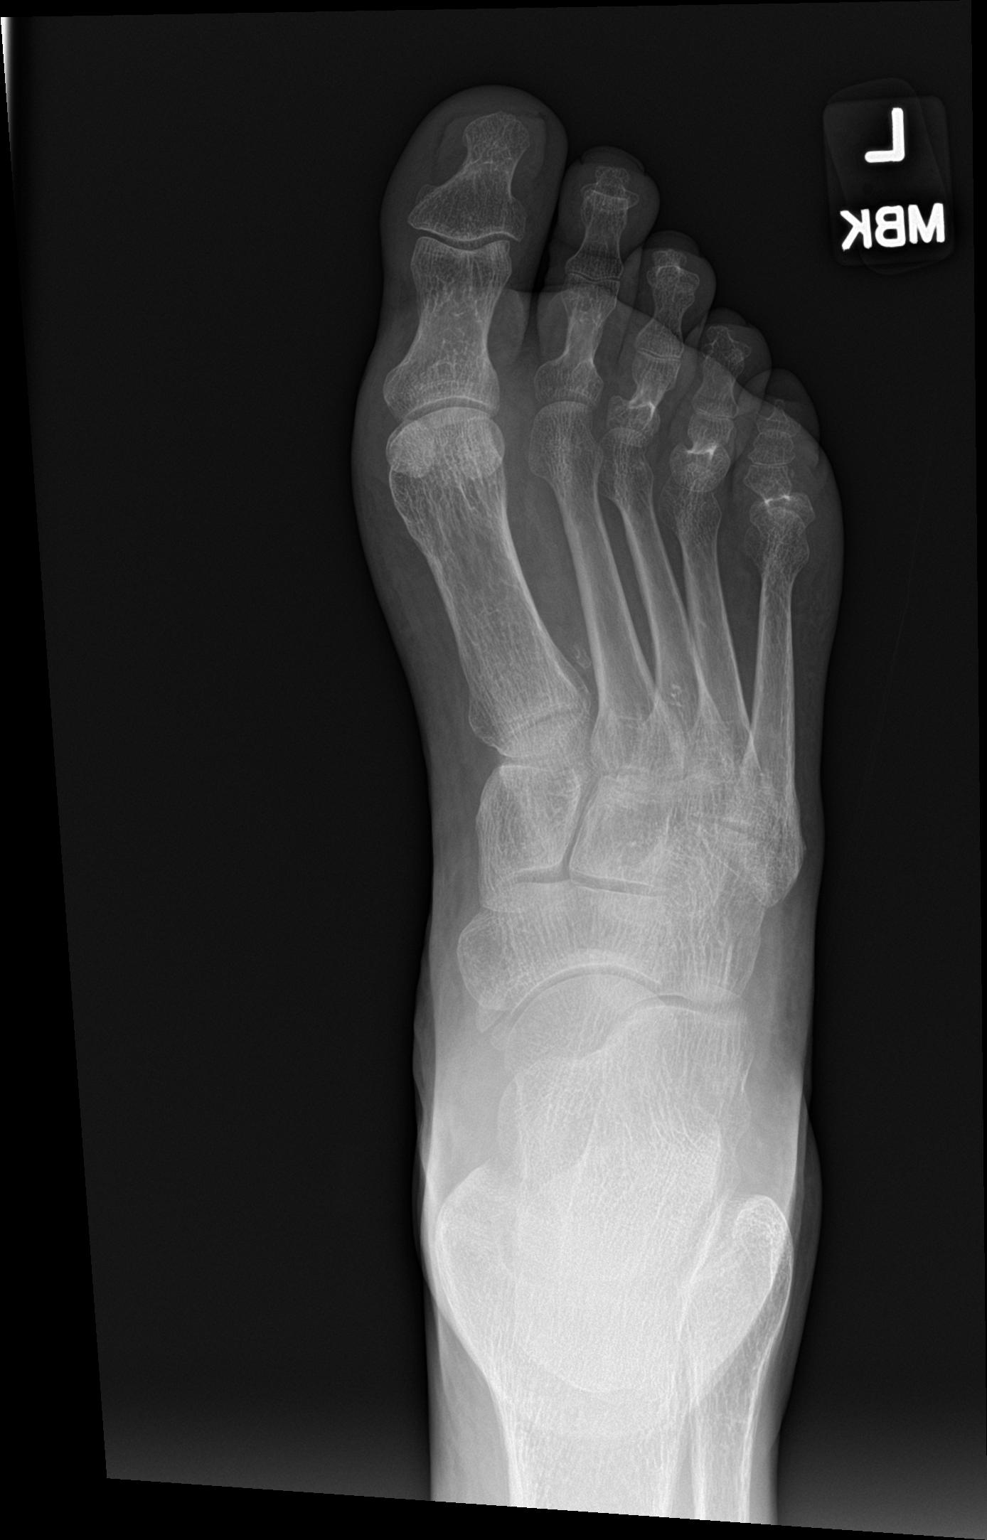

[foot obl]
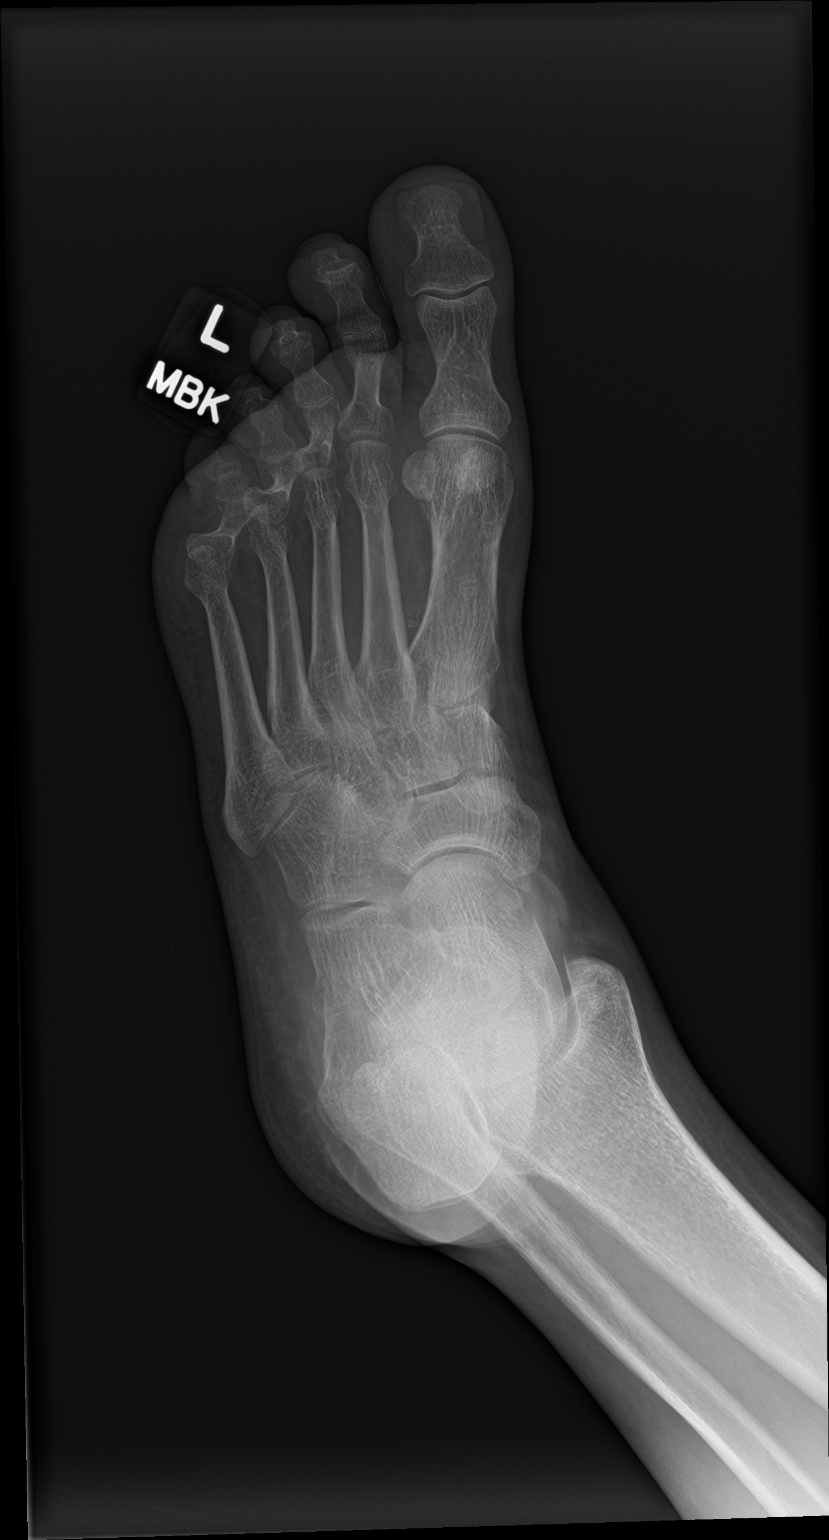

[foot lat]
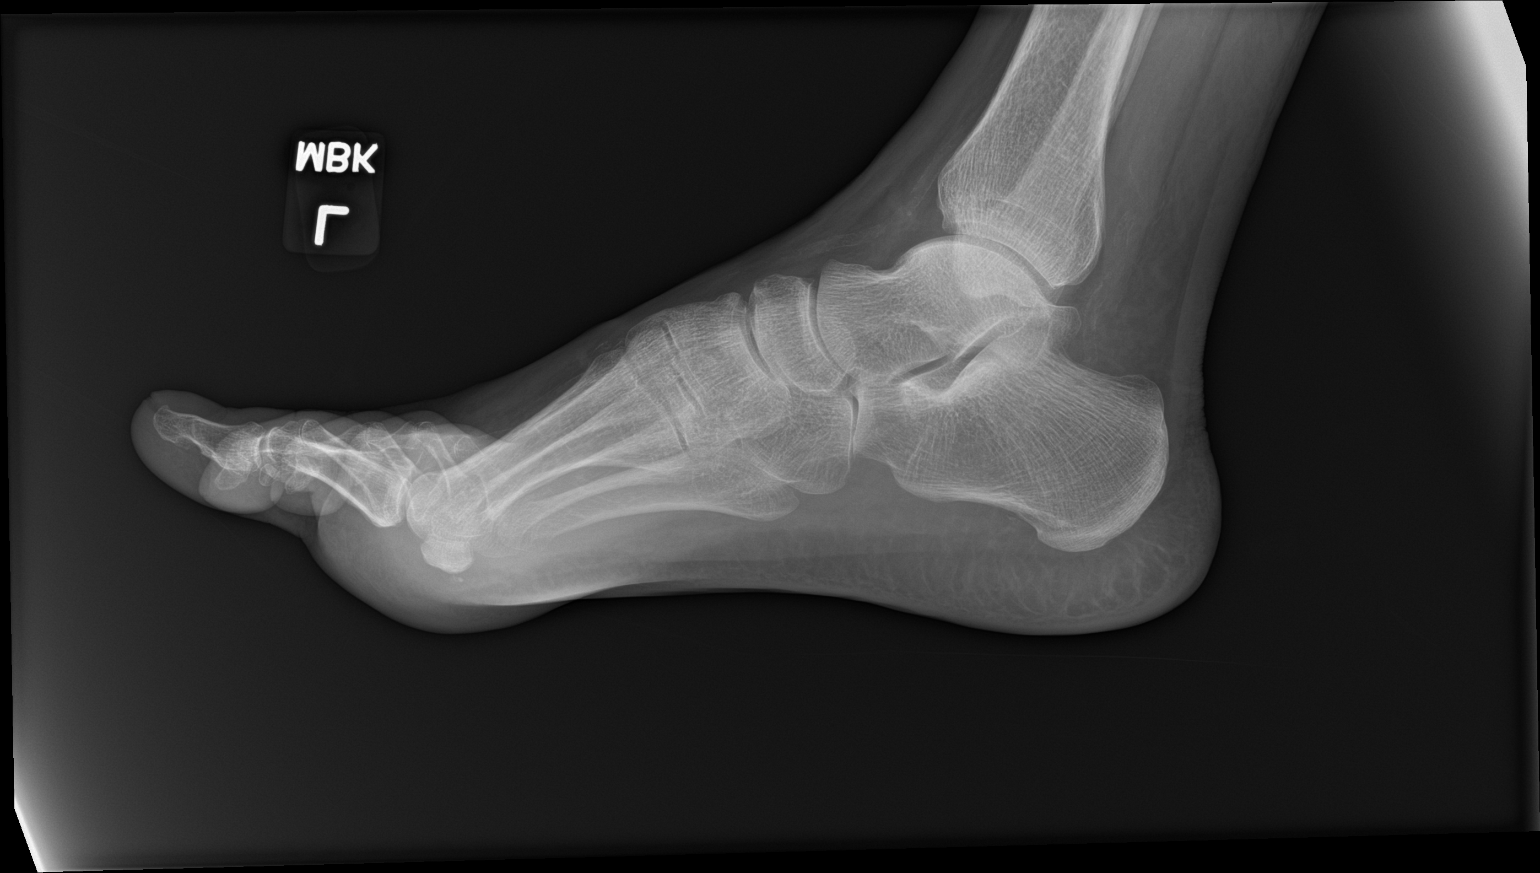

[3 of 3 positions shown; findings below may reference images not displayed]

FINDINGS: There is no evidence of fracture or dislocation. No cortical erosion
or destruction. There is no evidence of arthropathy or other focal
bone abnormality. Soft tissues are unremarkable. No retained
radiopaque foreign body.
IMPRESSION: Negative.

## 2021-08-05 IMAGING — US US EXTREM LOW VENOUS*R*
1 series · 14 of 24 positions shown · non-contrast
Comparison: None.

CLINICAL DATA: Right buttock pain radiating to the thigh

EXAM:
Right LOWER EXTREMITY VENOUS DOPPLER ULTRASOUND
TECHNIQUE: Gray-scale sonography with compression, as well as color and duplex
ultrasound, were performed to evaluate the deep venous system(s)
from the level of the common femoral vein through the popliteal and
proximal calf veins.

[Series 1: us extrem low venous*right* · 14 of 40 slices shown]
[im 1/40]
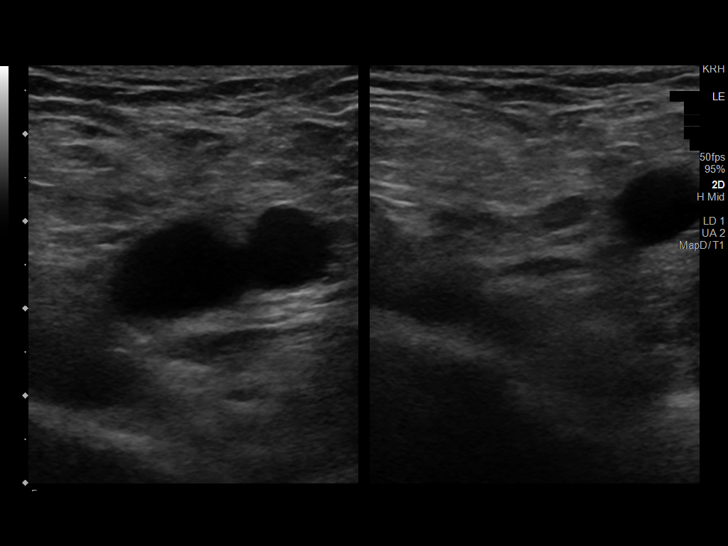
[im 4/40]
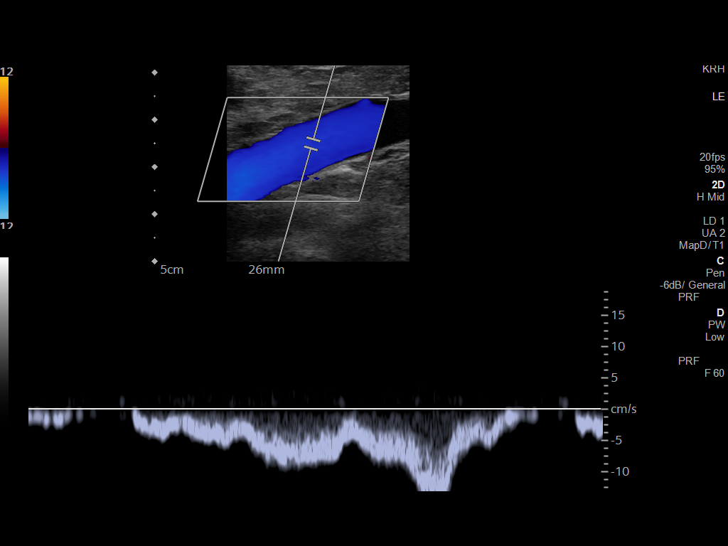
[im 7/40]
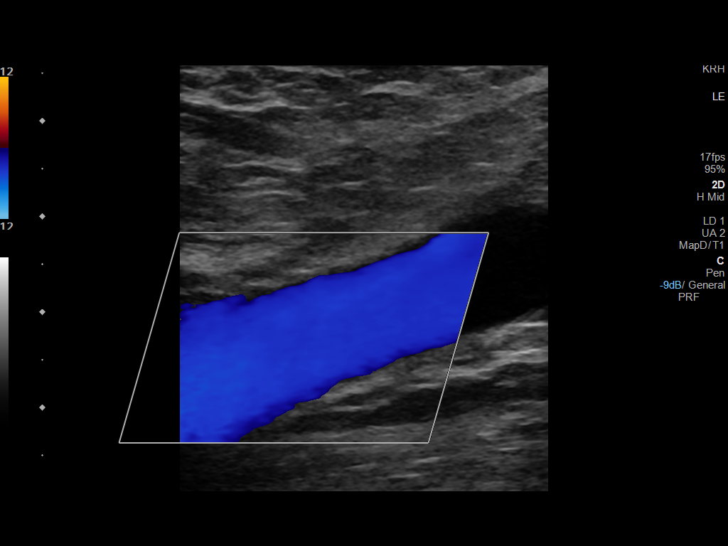
[im 11/40]
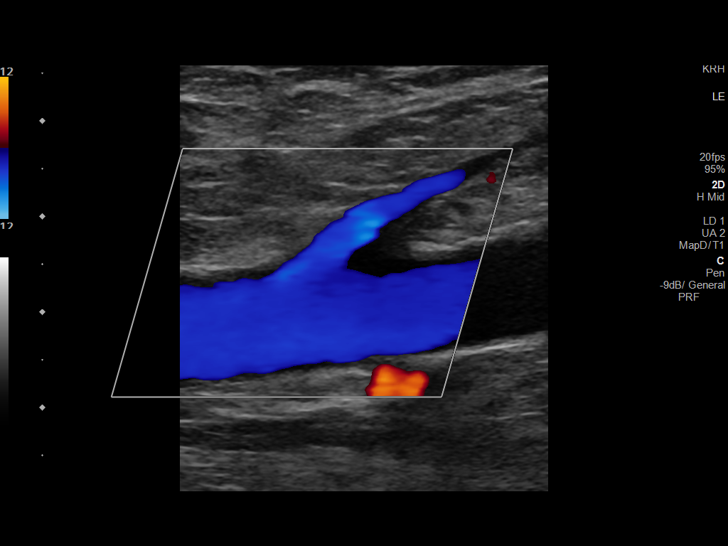
[im 12/40]
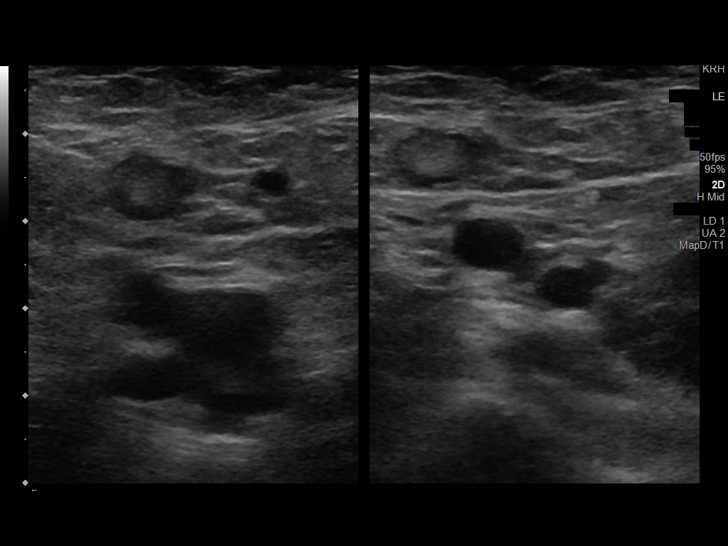
[im 16/40]
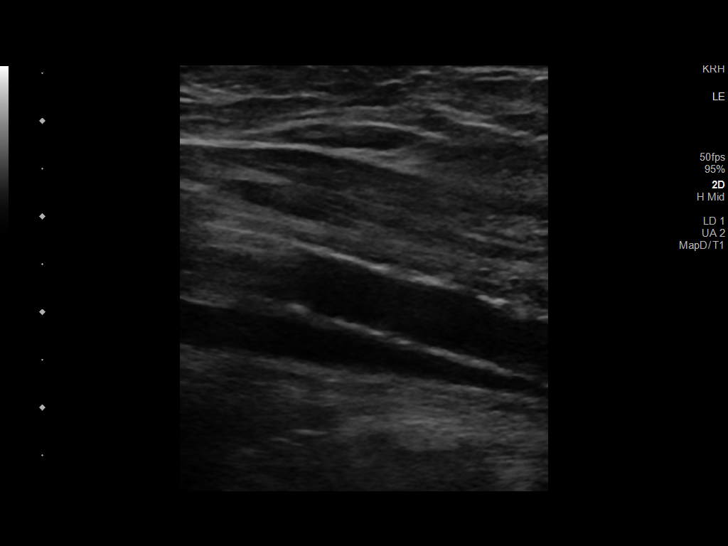
[im 19/40]
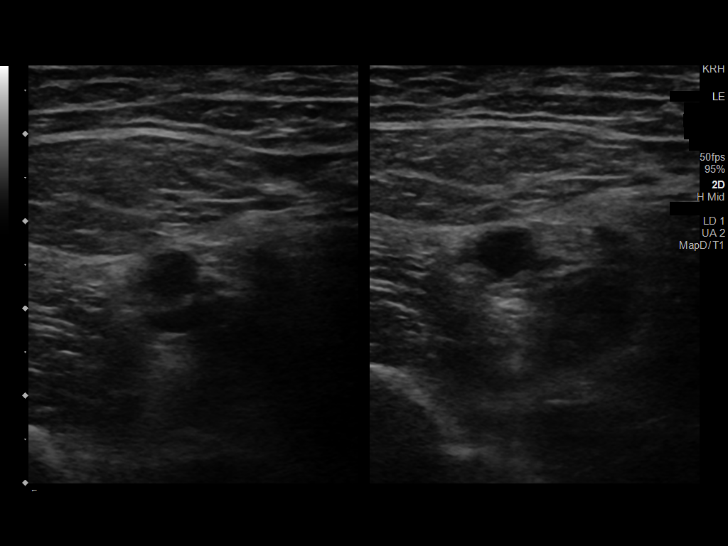
[im 21/40]
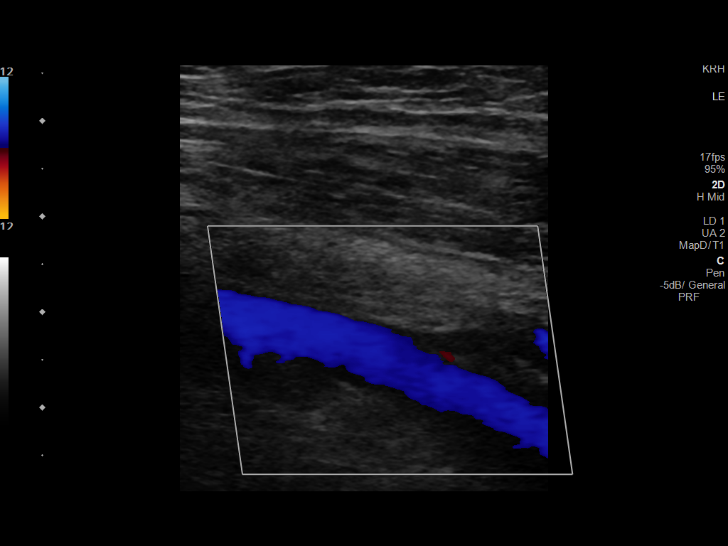
[im 24/40]
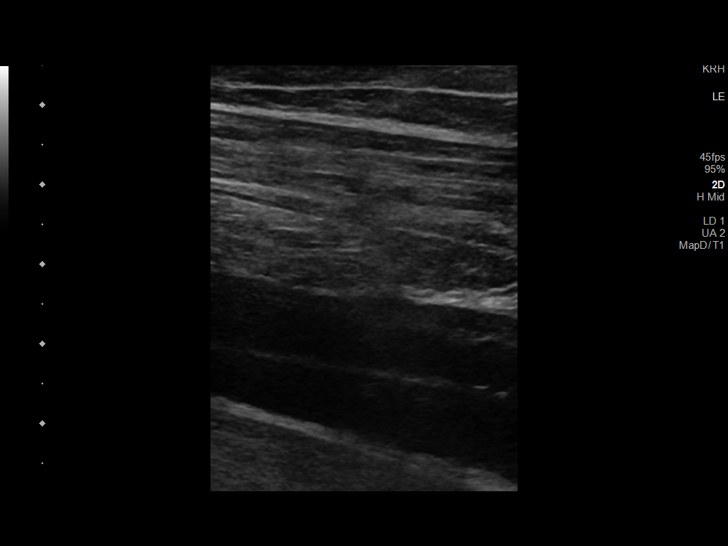
[im 28/40]
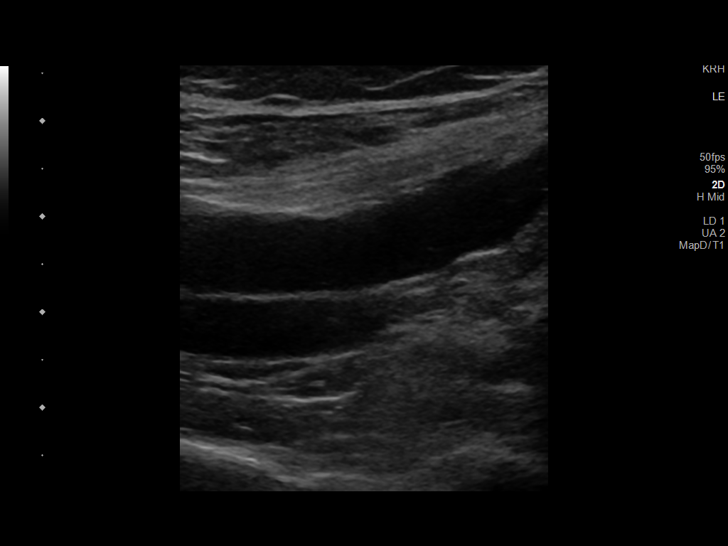
[im 31/40]
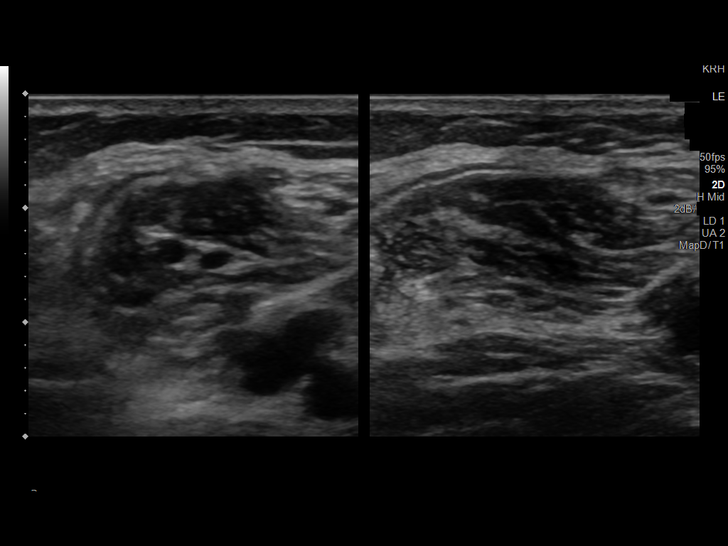
[im 33/40]
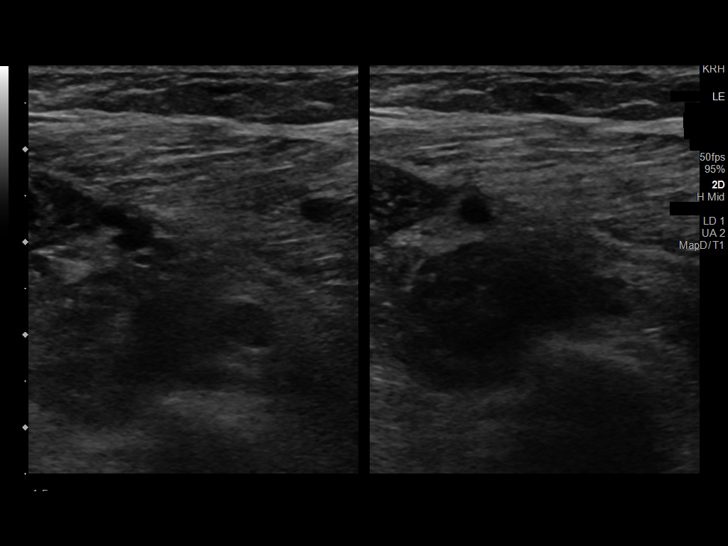
[im 36/40]
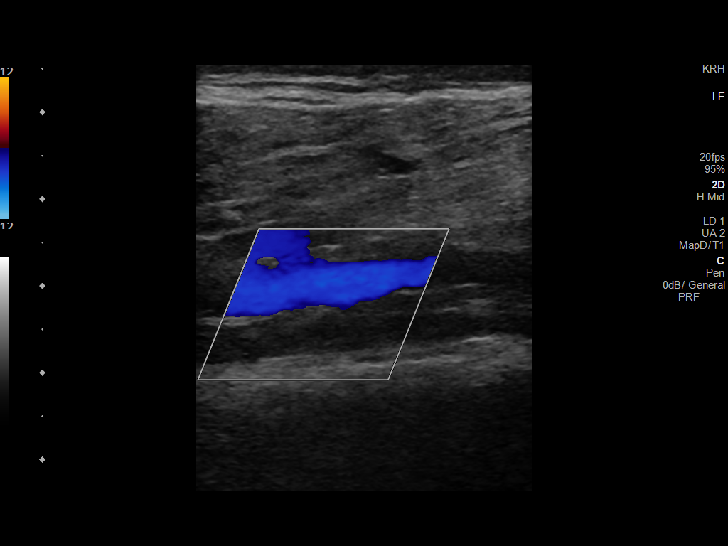
[im 40/40]
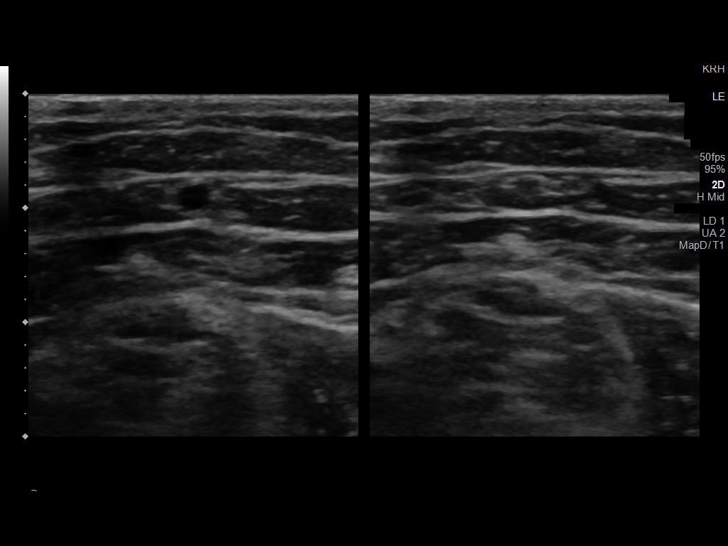

[14 of 24 positions shown; findings below may reference images not displayed]

FINDINGS: VENOUS

Normal compressibility of the common femoral, superficial femoral,
and popliteal veins, as well as the visualized calf veins.
Visualized portions of profunda femoral vein and great saphenous
vein unremarkable. No filling defects to suggest DVT on grayscale or
color Doppler imaging. Doppler waveforms show normal direction of
venous flow, normal respiratory plasticity and response to
augmentation.

Limited views of the contralateral common femoral vein are
unremarkable.

OTHER

None.

Limitations: none
IMPRESSION: Negative.

## 2021-10-05 ENCOUNTER — Encounter (HOSPITAL_BASED_OUTPATIENT_CLINIC_OR_DEPARTMENT_OTHER): Payer: Self-pay | Admitting: Urology

## 2021-10-05 ENCOUNTER — Emergency Department (HOSPITAL_BASED_OUTPATIENT_CLINIC_OR_DEPARTMENT_OTHER)
Admission: EM | Admit: 2021-10-05 | Discharge: 2021-10-05 | Disposition: A | Discharge status: Home / Self Care | Payer: Medicaid Other | Attending: Emergency Medicine | Admitting: Emergency Medicine

## 2021-10-05 ENCOUNTER — Other Ambulatory Visit: Payer: Self-pay

## 2021-10-05 ENCOUNTER — Emergency Department (HOSPITAL_BASED_OUTPATIENT_CLINIC_OR_DEPARTMENT_OTHER): Payer: Medicaid Other

## 2021-10-05 DIAGNOSIS — Z7982 Long term (current) use of aspirin: Secondary | ICD-10-CM | POA: Insufficient documentation

## 2021-10-05 DIAGNOSIS — M791 Myalgia, unspecified site: Secondary | ICD-10-CM | POA: Insufficient documentation

## 2021-10-05 DIAGNOSIS — Z20822 Contact with and (suspected) exposure to covid-19: Secondary | ICD-10-CM | POA: Insufficient documentation

## 2021-10-05 DIAGNOSIS — M549 Dorsalgia, unspecified: Secondary | ICD-10-CM | POA: Diagnosis not present

## 2021-10-05 DIAGNOSIS — E1165 Type 2 diabetes mellitus with hyperglycemia: Secondary | ICD-10-CM | POA: Insufficient documentation

## 2021-10-05 DIAGNOSIS — Z7984 Long term (current) use of oral hypoglycemic drugs: Secondary | ICD-10-CM | POA: Insufficient documentation

## 2021-10-05 DIAGNOSIS — R509 Fever, unspecified: Secondary | ICD-10-CM | POA: Insufficient documentation

## 2021-10-05 DIAGNOSIS — R5383 Other fatigue: Secondary | ICD-10-CM | POA: Diagnosis not present

## 2021-10-05 DIAGNOSIS — Z794 Long term (current) use of insulin: Secondary | ICD-10-CM | POA: Insufficient documentation

## 2021-10-05 DIAGNOSIS — R7401 Elevation of levels of liver transaminase levels: Secondary | ICD-10-CM | POA: Insufficient documentation

## 2021-10-05 DIAGNOSIS — R350 Frequency of micturition: Secondary | ICD-10-CM | POA: Insufficient documentation

## 2021-10-05 DIAGNOSIS — R300 Dysuria: Secondary | ICD-10-CM | POA: Diagnosis not present

## 2021-10-05 LAB — COMPREHENSIVE METABOLIC PANEL
ALT: 40 U/L (ref 0–44)
AST: 45 U/L — ABNORMAL HIGH (ref 15–41)
Albumin: 3.8 g/dL (ref 3.5–5.0)
Alkaline Phosphatase: 52 U/L (ref 38–126)
Anion gap: 9 (ref 5–15)
BUN: 16 mg/dL (ref 8–23)
CO2: 23 mmol/L (ref 22–32)
Calcium: 8.6 mg/dL — ABNORMAL LOW (ref 8.9–10.3)
Chloride: 103 mmol/L (ref 98–111)
Creatinine, Ser: 0.87 mg/dL (ref 0.61–1.24)
GFR, Estimated: >60 mL/min (ref >60)
Glucose, Bld: 171 mg/dL — ABNORMAL HIGH (ref 70–99)
Potassium: 4 mmol/L (ref 3.5–5.1)
Sodium: 135 mmol/L (ref 135–145)
Total Bilirubin: 0.5 mg/dL (ref 0.3–1.2)
Total Protein: 7.5 g/dL (ref 6.5–8.1)

## 2021-10-05 LAB — URINALYSIS, ROUTINE W REFLEX MICROSCOPIC
Bilirubin Urine: NEGATIVE
Glucose, UA: >=500 mg/dL — AB
Ketones, ur: NEGATIVE mg/dL
Leukocytes,Ua: NEGATIVE
Nitrite: NEGATIVE
Protein, ur: NEGATIVE mg/dL
Specific Gravity, Urine: 1.01 (ref 1.005–1.030)
pH: 6 (ref 5.0–8.0)

## 2021-10-05 LAB — CBC WITH DIFFERENTIAL/PLATELET
Abs Immature Granulocytes: 0.01 10*3/uL (ref 0.00–0.07)
Basophils Absolute: 0.1 10*3/uL (ref 0.0–0.1)
Basophils Relative: 1 %
Eosinophils Absolute: 0.2 10*3/uL (ref 0.0–0.5)
Eosinophils Relative: 3 %
HCT: 41.4 % (ref 39.0–52.0)
Hemoglobin: 13.8 g/dL (ref 13.0–17.0)
Immature Granulocytes: 0 %
Lymphocytes Relative: 27 %
Lymphs Abs: 1.6 10*3/uL (ref 0.7–4.0)
MCH: 29.1 pg (ref 26.0–34.0)
MCHC: 33.3 g/dL (ref 30.0–36.0)
MCV: 87.3 fL (ref 80.0–100.0)
Monocytes Absolute: 0.6 10*3/uL (ref 0.1–1.0)
Monocytes Relative: 10 %
Neutro Abs: 3.5 10*3/uL (ref 1.7–7.7)
Neutrophils Relative %: 59 %
Platelets: 221 10*3/uL (ref 150–400)
RBC: 4.74 MIL/uL (ref 4.22–5.81)
RDW: 13.2 % (ref 11.5–15.5)
WBC: 5.8 10*3/uL (ref 4.0–10.5)
nRBC: 0 % (ref 0.0–0.2)

## 2021-10-05 LAB — CBG MONITORING, ED: Glucose-Capillary: 171 mg/dL — ABNORMAL HIGH (ref 70–99)

## 2021-10-05 LAB — URINALYSIS, MICROSCOPIC (REFLEX): WBC, UA: NONE SEEN WBC/hpf (ref 0–5)

## 2021-10-05 LAB — SARS CORONAVIRUS 2 BY RT PCR: SARS Coronavirus 2 by RT PCR: NEGATIVE

## 2021-10-05 LAB — LACTIC ACID, PLASMA
Lactic Acid, Venous: 2.2 mmol/L (ref 0.5–1.9)
Lactic Acid, Venous: 1.5 mmol/L (ref 0.5–1.9)

## 2021-10-05 LAB — CK: Total CK: 230 U/L (ref 49–397)

## 2021-10-05 MED ORDER — SODIUM CHLORIDE 0.9 % IV SOLN
1.0000 g | INTRAVENOUS | Status: DC
  Administered 2021-10-05: 1 g via INTRAVENOUS
  Filled 2021-10-05: qty 10

## 2021-10-05 MED ORDER — SODIUM CHLORIDE 0.9 % IV BOLUS
1000.0000 mL | Freq: Once | INTRAVENOUS | Status: AC
  Administered 2021-10-05: 1000 mL via INTRAVENOUS

## 2021-10-05 MED ORDER — KETOROLAC TROMETHAMINE 15 MG/ML IJ SOLN
15.0000 mg | Freq: Once | INTRAMUSCULAR | Status: AC
  Administered 2021-10-05: 15 mg via INTRAVENOUS
  Filled 2021-10-05: qty 1

## 2021-10-05 MED ORDER — CEPHALEXIN 500 MG PO CAPS: 500.0000 mg | ORAL_CAPSULE | Freq: Four times a day (QID) | ORAL | 0 refills | Status: DC

## 2021-10-05 NOTE — ED Triage Notes (Signed)
Pt states fever at home x 3 days and fatigue States numbness and tingling to bilateral feet and hands, pain to skin generalized  Took Ibuprofen yest but none today  States frequent urination and pain with urination  H/o DM

## 2021-10-05 NOTE — Discharge Instructions (Signed)
It was a pleasure taking care of you today. Your labs were reassuring. COVID test was negative. I am sending you home with an antibiotic.  Take as prescribed and finish all antibiotics.  Please follow-up with PCP within the next 2 to 3 days.  Return to the ER for new or worsening symptoms.

## 2021-10-05 NOTE — ED Provider Notes (Signed)
[facility] EMERGENCY DEPARTMENT Provider Note   CSN: [id number] Arrival date & time: 10/05/21  1637     History  Chief Complaint  Patient presents with   Fever    Adrian Bernard is a 66 y.o. male with a past medical history significant for diabetes and hyperlipidemia who presents to the ED due to fever, myalgias, and fatigue x3 days.  Patient also endorses numbness/tingling to bilateral hands and feet.  No sick contacts or COVID exposures.  Denies cough and sore throat.  He also endorses dysuria and frequent urination.  No hematuria.  Denies abdominal pain. And back pain. No chest pain or shortness of breath.  He has been taking ibuprofen with moderate relief.  Patient states he has a shooting pain in his penis during urination.  No testicular pain or edema.  No penile discharge.  Denies concerns for STIs.  History obtained from patient and past medical records. No interpreter used during encounter.       Home Medications Prior to Admission medications   Medication Sig Start Date End Date Taking? Authorizing Provider  cephALEXin (KEFLEX) 500 MG capsule Take 1 capsule (500 mg total) by mouth 4 (four) times daily. 10/05/21  Yes Tanisa Lagace, Druscilla Brownie, PA-C  Ascorbic Acid (VITAMIN C) 500 MG CAPS Take by mouth daily at 6 (six) AM.    [provider]  aspirin 81 MG EC tablet Take 81 mg by mouth daily. Pt instructed to stop 81 asa 2 days before 04-25-2021 surgery by dr Fritzi Mandes    [provider]  buPROPion (WELLBUTRIN XL) 150 MG 24 hr tablet Take 150 mg by mouth daily.    [provider]  Cyanocobalamin (VITAMIN B 12 PO) Take by mouth daily.    [provider]  empagliflozin (JARDIANCE) 25 MG TABS tablet Take by mouth daily.    [provider]  gabapentin (NEURONTIN) 300 MG capsule Take 300 mg by mouth at bedtime.    [provider]  ibuprofen (ADVIL) 800 MG tablet Take 800 mg by mouth every 8 (eight) hours as needed.    [provider]  JANUVIA 100 MG tablet Take 100 mg by mouth daily.  02/23/16   [provider]  metFORMIN (GLUCOPHAGE) 1000 MG tablet Take 1,000 mg by mouth 2 (two) times daily. Take 1 tab in the morning and 1 tab in the evening     [provider]  Multiple Vitamins-Minerals (MULTIVITAMIN WITH MINERALS) tablet Take 1 tablet by mouth daily.    [provider]  omeprazole (PRILOSEC) 20 MG capsule Take 20 mg by mouth at bedtime.    [provider]  rosuvastatin (CRESTOR) 20 MG tablet Take 20 mg by mouth daily.    [provider]  Semaglutide, 1 MG/DOSE, (OZEMPIC, 1 MG/DOSE,) 4 MG/3ML SOPN Inject 4 mg into the skin once a week. friday    [provider]  tadalafil (CIALIS) 10 MG tablet Take 10 mg by mouth daily as needed for erectile dysfunction.    [provider]  VITAMIN D PO Take by mouth daily.    [provider]      Allergies    Patient has no known allergies.    Review of Systems   Review of Systems  Constitutional:  Positive for chills, fatigue and fever.  Respiratory:  Negative for cough and shortness of breath.   Cardiovascular:  Negative for chest pain.  Gastrointestinal:  Negative for abdominal pain, diarrhea, nausea and vomiting.  Genitourinary:  Positive for dysuria and frequency. Negative for flank pain, scrotal swelling and testicular pain.  Musculoskeletal:  Negative for back pain.  Neurological:  Positive for numbness.    Physical Exam Updated Vital Signs BP (!) 109/54   Pulse 81   Temp 100.1 F (37.8 C)   Resp 16   Ht '5\' 8"'$  (1.727 m)   Wt 73.5 kg   SpO2 94%   BMI 24.63 kg/m  Physical Exam Vitals and nursing note reviewed.  Constitutional:      General: He is not in acute distress.    Appearance: He is not ill-appearing.  HENT:     Head: Normocephalic.  Eyes:     Pupils: Pupils are equal, round, and reactive to light.  Cardiovascular:     Rate and Rhythm: Normal rate and regular  rhythm.     Pulses: Normal pulses.     Heart sounds: Normal heart sounds. No murmur heard.    No friction rub. No gallop.  Pulmonary:     Effort: Pulmonary effort is normal.     Breath sounds: Normal breath sounds.  Abdominal:     General: Abdomen is flat. There is no distension.     Palpations: Abdomen is soft.     Tenderness: There is no abdominal tenderness. There is no guarding or rebound.     Comments: Abdomen soft, nondistended, nontender to palpation in all quadrants without guarding or peritoneal signs. No rebound.   Musculoskeletal:        General: Normal range of motion.     Cervical back: Neck supple.  Skin:    General: Skin is warm and dry.  Neurological:     General: No focal deficit present.     Mental Status: He is alert.  Psychiatric:        Mood and Affect: Mood normal.        Behavior: Behavior normal.     ED Results / Procedures / Treatments   Labs (all labs ordered are listed, but only abnormal results are displayed) Labs Reviewed  URINALYSIS, ROUTINE W REFLEX MICROSCOPIC - Abnormal; Notable for the following components:      Result Value   Glucose, UA >=500 (*)    Hgb urine dipstick TRACE (*)    All other components within normal limits  LACTIC ACID, PLASMA - Abnormal; Notable for the following components:   Lactic Acid, Venous 2.2 (*)    All other components within normal limits  COMPREHENSIVE METABOLIC PANEL - Abnormal; Notable for the following components:   Glucose, Bld 171 (*)    Calcium 8.6 (*)    AST 45 (*)    All other components within normal limits  URINALYSIS, MICROSCOPIC (REFLEX) - Abnormal; Notable for the following components:   Bacteria, UA RARE (*)    All other components within normal limits  CBG MONITORING, ED - Abnormal; Notable for the following components:   Glucose-Capillary 171 (*)    All other components within normal limits  SARS CORONAVIRUS 2 BY RT PCR  CULTURE, BLOOD (ROUTINE X 2)  CULTURE, BLOOD (ROUTINE X 2)  URINE  CULTURE  LACTIC ACID, PLASMA  CBC WITH DIFFERENTIAL/PLATELET  CK    EKG None  Radiology DG Chest 2 View  Result Date: 10/05/2021 CLINICAL DATA:  Fever short of breath EXAM: CHEST - 2 VIEW COMPARISON:  09/25/2018 FINDINGS: No acute airspace disease or effusion. Normal cardiac size. No pneumothorax. Hardware in the cervical spine. IMPRESSION: No active cardiopulmonary disease. Electronically Signed  By: Donavan Foil M.D.   On: 10/05/2021 17:20    Procedures Procedures    Medications Ordered in ED Medications  cefTRIAXone (ROCEPHIN) 1 g in sodium chloride 0.9 % 100 mL IVPB (0 g Intravenous Stopped 10/05/21 1938)  sodium chloride 0.9 % bolus 1,000 mL (0 mLs Intravenous Stopped 10/05/21 1938)  ketorolac (TORADOL) 15 MG/ML injection 15 mg (15 mg Intravenous Given 10/05/21 1908)    ED Course/ Medical Decision Making/ A&P Clinical Course as of 10/05/21 2021  Wed Oct 05, 2021  1758 Lactic Acid, Venous(!!): 2.2 [CA]  1758 WBC: 5.8 [CA]  1851 I reevaluated this patient at bedside.  He is endorsing a 3-day history of dysuria, urinary frequency, decreased p.o. intake because he feels that he has been going to the bathroom too frequently.  He endorses subjective chills but denies outright fever.  He is otherwise ambulatory tolerating p.o. intake.  He has not taken any medication other than ibuprofen yesterday which did cause significant symptomatic improvement.  He endorsed history of frequent UTI secondary to his uncontrolled diabetes in the past.  He states this feels similar.  [CC]  1852 Given the degree of dysuria, pain, will give patient symptomatic treatment including IV ketorolac and plan for reassessment.  Additionally, will start patient on Rocephin 1 g IV and plan for outpatient care and management with Keflex p.o.  With plan to follow-up with primary care provider within 72 hours. [CC]  [phone] Shared medical decision making with patient regarding observation versus outpatient care and  management and patient requested outpatient discharge with close follow-up with PCP which I believe is reasonable at this time.  I do not believe his clinical course represents pyelonephritis, obstructive nephrolithiasis, sepsis at this time.  He is endorsing intentional self dehydration to decrease how often he was peeing thus explaining the lactic.  Lactic acid will need to be cleared on repeat labs after IV fluids. [CC]  2006 Lactic Acid, Venous: 1.5 [CA]    Clinical Course User Index [CA] Suzy Bouchard, PA-C [CC] Tretha Sciara, MD                           Medical Decision Making Amount and/or Complexity of Data Reviewed Labs: ordered. Decision-making details documented in ED Course. Radiology: ordered.  Risk Prescription drug management.   66 year old male presents to the ED due to fever, fatigue, numbness/tingling to bilateral hands and feet, and myalgias x3 days.  No sick contacts or known COVID exposures.  Denies cough and sore throat.  No abdominal pain.  He also endorses some urinary symptoms.  Upon arrival, patient febrile 100.4 F and tachycardic at 107.  No hypoxia.  Patient nontoxic-appearing.  Reassuring physical exam.  Abdomen soft, nondistended, nontender.  Low suspicion for acute abdomen.  No meningismus to suggest meningitis. Given fever and tachycardia, infectious labs ordered.  IV fluids started.  COVID to rule out infection.  CBC unremarkable.  No leukocytosis and normal hemoglobin.  CMP significant for hyperglycemia at 171 without an anion gap.  Doubt DKA.  Mild elevation in AST at 45.  Normal renal function.  No major electrolyte derangements.  UA significant for trace hematuria.  No signs of infection.  Lactic acid elevated at 2.2.  IV fluids already started.  Added blood cultures.  COVID-negative.  Chest x-ray personally reviewed and interpreted which is negative for signs of pneumonia, pneumothorax, or widened mediastinum.  Repeat lactic acid normal after IV  fluids.  Patient given IV Rocephin given his urinary symptoms after discussion with Dr. Oswald Hillock.  Upon reassessment, patient notes improvement in symptoms.  Dr. Oswald Hillock had a long discussion with patient in regards to observation versus outpatient follow-up and patient prefers to be discharged and follow-up with his PCP.  Possible viral etiology of symptoms.  COVID test negative.  Low suspicion for pyelonephritis or infected kidney stone given no back pain.  CK normal.  Will discharge patient with Keflex given urinary symptoms.  Urine culture pending.  Advised patient follow-up with PCP within the next 2 to 3 days. Strict ED precautions discussed with patient. Patient states understanding and agrees to plan. Patient discharged home in no acute distress and stable vitals  Discussed with Dr. Oswald Hillock who evaluated patient at bedside and agrees with assessment and plan.         Final Clinical Impression(s) / ED Diagnoses Final diagnoses:  Febrile illness    Rx / DC Orders ED Discharge Orders          Ordered    cephALEXin (KEFLEX) 500 MG capsule  4 times daily        10/05/21 2006              Karie Kirks 10/05/21 2022    Tretha Sciara, MD 10/05/21 [phone]

## 2021-10-05 NOTE — ED Notes (Signed)
Pt d/c home per MD order. Discharge summary reviewed, pt verbalizes understanding. Ambulatory off unit. No s/s of acute distress noted.

## 2021-10-06 LAB — URINE CULTURE: Culture: NO GROWTH

## 2021-10-10 LAB — CULTURE, BLOOD (ROUTINE X 2)
Culture: NO GROWTH
Culture: NO GROWTH
Special Requests: ADEQUATE
Special Requests: ADEQUATE

## 2021-10-28 NOTE — Progress Notes (Deleted)
Office Visit Note  Patient: Adrian Bernard             Date of Birth: Oct 04, 1955           MRN: [id number]             PCP: Katherina Mires, MD Referring: Katherina Mires, MD Visit Date: 11/01/2021 Occupation: '@GUAROCC' @  Subjective:  No chief complaint on file.   History of Present Illness: Adrian Bernard is a 67 y.o. male ***   Activities of Daily Living:  Patient reports morning stiffness for *** {minute/hour:19697}.   Patient {ACTIONS;DENIES/REPORTS:21021675::"Denies"} nocturnal pain.  Difficulty dressing/grooming: {ACTIONS;DENIES/REPORTS:21021675::"Denies"} Difficulty climbing stairs: {ACTIONS;DENIES/REPORTS:21021675::"Denies"} Difficulty getting out of chair: {ACTIONS;DENIES/REPORTS:21021675::"Denies"} Difficulty using hands for taps, buttons, cutlery, and/or writing: {ACTIONS;DENIES/REPORTS:21021675::"Denies"}  No Rheumatology ROS completed.   PMFS History:  Patient Active Problem List   Diagnosis Date Noted   SPASM, MUSCLE 02/08/2009   DRY COUGH 02/08/2009   CHEST PAIN, ATYPICAL 02/08/2009   EXTERNAL HEMORRHOIDS 06/23/2008   ECZEMA 06/23/2008   WEIGHT LOSS, RECENT 05/22/2008   DIABETES-TYPE 2 01/01/2007   HYPERCHOLESTEROLEMIA 03/29/2006   TOBACCO DEPENDENCE 03/29/2006   BACK PAIN, LOW 03/29/2006   CHEST PAIN 03/29/2006    Past Medical History:  Diagnosis Date   Arthritis    fingers of both hands   GERD (gastroesophageal reflux disease)    High cholesterol    History of COVID-19 12/29/2020   mild symptoms all symptoms resolved   History of hypertension    no meds x 1 year since weight loss per pt on 04-21-2021   Lower back pain    Restless legs syndrome    Type 2 Diabetes mellitus without complication (HCC)    Vitamin D deficiency    Wears denture upper is implants hard to remove per pt    Wears partial denture lower     Family History  Problem Relation Age of Onset   Heart disease Father    Diabetes Brother    Heart disease Brother     Diabetes Brother    Heart disease Brother    Colon cancer Neg Hx    Past Surgical History:  Procedure Laterality Date   cervical spinal anterior discectomy with fusion C 3 to C 5  06/03/2020   done @ [facility]   dental implants     2020   Social History   Social History Narrative   Drinks about 4-5 cups of coffee a day    Immunization History  Administered Date(s) Administered   H1N1 02/05/2008   Influenza Whole 01/01/2007, 02/05/2008   Td 08/03/2002   Tdap 09/25/2018     Objective: Vital Signs: There were no vitals taken for this visit.   Physical Exam   Musculoskeletal Exam: ***  CDAI Exam: CDAI Score: -- Patient Global: --; Provider Global: -- Swollen: --; Tender: -- Joint Exam 11/01/2021   No joint exam has been documented for this visit   There is currently no information documented on the homunculus. Go to the Rheumatology activity and complete the homunculus joint exam.  Investigation: No additional findings.  Imaging: DG Chest 2 View  Result Date: 10/05/2021 CLINICAL DATA:  Fever short of breath EXAM: CHEST - 2 VIEW COMPARISON:  09/25/2018 FINDINGS: No acute airspace disease or effusion. Normal cardiac size. No pneumothorax. Hardware in the cervical spine. IMPRESSION: No active cardiopulmonary disease. Electronically Signed   By: Donavan Foil M.D.   On: 10/05/2021 17:20    Recent Labs: Lab Results  Component Value Date   WBC 5.8 10/05/2021   HGB 13.8 10/05/2021   PLT 221 10/05/2021   NA 135 10/05/2021   K 4.0 10/05/2021   CL 103 10/05/2021   CO2 23 10/05/2021   GLUCOSE 171 (H) 10/05/2021   BUN 16 10/05/2021   CREATININE 0.87 10/05/2021   BILITOT 0.5 10/05/2021   ALKPHOS 52 10/05/2021   AST 45 (H) 10/05/2021   ALT 40 10/05/2021   PROT 7.5 10/05/2021   ALBUMIN 3.8 10/05/2021   CALCIUM 8.6 (L) 10/05/2021   GFRAA >60 09/25/2018    Speciality Comments: No specialty comments available.  Procedures:  No procedures  performed Allergies: Patient has no known allergies.   Assessment / Plan:     Visit Diagnoses: Polyarthralgia - Uric acid 4.2, CRP<1, ANA negative, RF<10, Anti-CCP 41, ESR 48  Joint stiffness  Radiculopathy, cervical region  Muscle spasm  Multiple thyroid nodules  History of type 2 diabetes mellitus  Hypercholesterolemia  History of eczema  OSA (obstructive sleep apnea)  RLS (restless legs syndrome)  Orders: No orders of the defined types were placed in this encounter.  No orders of the defined types were placed in this encounter.   Face-to-face time spent with patient was *** minutes. Greater than 50% of time was spent in counseling and coordination of care.  Follow-Up Instructions: No follow-ups on file.   Ofilia Neas, PA-C  Note - This record has been created using Dragon software.  Chart creation errors have been sought, but may not always  have been located. Such creation errors do not reflect on  the standard of medical care.

## 2021-11-01 ENCOUNTER — Ambulatory Visit: Payer: Medicaid Other | Attending: Rheumatology | Admitting: Rheumatology

## 2021-11-01 DIAGNOSIS — G4733 Obstructive sleep apnea (adult) (pediatric): Secondary | ICD-10-CM

## 2021-11-01 DIAGNOSIS — E042 Nontoxic multinodular goiter: Secondary | ICD-10-CM

## 2021-11-01 DIAGNOSIS — M62838 Other muscle spasm: Secondary | ICD-10-CM

## 2021-11-01 DIAGNOSIS — Z872 Personal history of diseases of the skin and subcutaneous tissue: Secondary | ICD-10-CM

## 2021-11-01 DIAGNOSIS — E7800 Pure hypercholesterolemia, unspecified: Secondary | ICD-10-CM

## 2021-11-01 DIAGNOSIS — M2550 Pain in unspecified joint: Secondary | ICD-10-CM

## 2021-11-01 DIAGNOSIS — Z8639 Personal history of other endocrine, nutritional and metabolic disease: Secondary | ICD-10-CM

## 2021-11-01 DIAGNOSIS — M2560 Stiffness of unspecified joint, not elsewhere classified: Secondary | ICD-10-CM

## 2021-11-01 DIAGNOSIS — G2581 Restless legs syndrome: Secondary | ICD-10-CM

## 2021-11-01 DIAGNOSIS — M5412 Radiculopathy, cervical region: Secondary | ICD-10-CM

## 2021-11-19 ENCOUNTER — Other Ambulatory Visit: Payer: Self-pay

## 2021-11-19 ENCOUNTER — Encounter (HOSPITAL_BASED_OUTPATIENT_CLINIC_OR_DEPARTMENT_OTHER): Payer: Self-pay | Admitting: Pediatrics

## 2021-11-19 ENCOUNTER — Emergency Department (HOSPITAL_BASED_OUTPATIENT_CLINIC_OR_DEPARTMENT_OTHER)
Admission: EM | Admit: 2021-11-19 | Discharge: 2021-11-19 | Disposition: A | Discharge status: Home / Self Care | Payer: Medicaid Other | Attending: Emergency Medicine | Admitting: Emergency Medicine

## 2021-11-19 ENCOUNTER — Emergency Department (HOSPITAL_BASED_OUTPATIENT_CLINIC_OR_DEPARTMENT_OTHER): Payer: Medicaid Other

## 2021-11-19 DIAGNOSIS — Z7982 Long term (current) use of aspirin: Secondary | ICD-10-CM | POA: Diagnosis not present

## 2021-11-19 DIAGNOSIS — J189 Pneumonia, unspecified organism: Secondary | ICD-10-CM | POA: Diagnosis not present

## 2021-11-19 DIAGNOSIS — Z794 Long term (current) use of insulin: Secondary | ICD-10-CM | POA: Diagnosis not present

## 2021-11-19 DIAGNOSIS — F17210 Nicotine dependence, cigarettes, uncomplicated: Secondary | ICD-10-CM | POA: Diagnosis not present

## 2021-11-19 DIAGNOSIS — E119 Type 2 diabetes mellitus without complications: Secondary | ICD-10-CM | POA: Insufficient documentation

## 2021-11-19 DIAGNOSIS — Z7984 Long term (current) use of oral hypoglycemic drugs: Secondary | ICD-10-CM | POA: Insufficient documentation

## 2021-11-19 DIAGNOSIS — Z1152 Encounter for screening for COVID-19: Secondary | ICD-10-CM | POA: Diagnosis not present

## 2021-11-19 DIAGNOSIS — R0789 Other chest pain: Secondary | ICD-10-CM | POA: Diagnosis present

## 2021-11-19 DIAGNOSIS — I7121 Aneurysm of the ascending aorta, without rupture: Secondary | ICD-10-CM | POA: Diagnosis not present

## 2021-11-19 LAB — BASIC METABOLIC PANEL
Anion gap: 6 (ref 5–15)
BUN: 21 mg/dL (ref 8–23)
CO2: 23 mmol/L (ref 22–32)
Calcium: 8.4 mg/dL — ABNORMAL LOW (ref 8.9–10.3)
Chloride: 108 mmol/L (ref 98–111)
Creatinine, Ser: 1.09 mg/dL (ref 0.61–1.24)
GFR, Estimated: >60 mL/min (ref >60)
Glucose, Bld: 227 mg/dL — ABNORMAL HIGH (ref 70–99)
Potassium: 4.3 mmol/L (ref 3.5–5.1)
Sodium: 137 mmol/L (ref 135–145)

## 2021-11-19 LAB — CBC
HCT: 39.9 % (ref 39.0–52.0)
Hemoglobin: 13 g/dL (ref 13.0–17.0)
MCH: 29.5 pg (ref 26.0–34.0)
MCHC: 32.6 g/dL (ref 30.0–36.0)
MCV: 90.7 fL (ref 80.0–100.0)
Platelets: 276 10*3/uL (ref 150–400)
RBC: 4.4 MIL/uL (ref 4.22–5.81)
RDW: 13.4 % (ref 11.5–15.5)
WBC: 13.4 10*3/uL — ABNORMAL HIGH (ref 4.0–10.5)
nRBC: 0 % (ref 0.0–0.2)

## 2021-11-19 LAB — RESP PANEL BY RT-PCR (FLU A&B, COVID) ARPGX2
Influenza A by PCR: NEGATIVE
Influenza B by PCR: NEGATIVE
SARS Coronavirus 2 by RT PCR: NEGATIVE

## 2021-11-19 LAB — TROPONIN I (HIGH SENSITIVITY)
Troponin I (High Sensitivity): 3 ng/L (ref <18)
Troponin I (High Sensitivity): 3 ng/L (ref <18)

## 2021-11-19 MED ORDER — DOXYCYCLINE HYCLATE 100 MG PO CAPS: 100.0000 mg | ORAL_CAPSULE | Freq: Two times a day (BID) | ORAL | 0 refills | Status: DC

## 2021-11-19 MED ORDER — MAGNESIUM SULFATE 2 GM/50ML IV SOLN
2.0000 g | INTRAVENOUS | Status: AC
  Administered 2021-11-19: 2 g via INTRAVENOUS
  Filled 2021-11-19: qty 50

## 2021-11-19 MED ORDER — SODIUM CHLORIDE 0.9 % IV SOLN
1.0000 g | Freq: Once | INTRAVENOUS | Status: AC
  Administered 2021-11-19: 1 g via INTRAVENOUS
  Filled 2021-11-19: qty 10

## 2021-11-19 MED ORDER — BENZONATATE 100 MG PO CAPS: 100.0000 mg | ORAL_CAPSULE | Freq: Three times a day (TID) | ORAL | 0 refills | Status: DC

## 2021-11-19 MED ORDER — AEROCHAMBER PLUS FLO-VU MEDIUM MISC
1.0000 | Freq: Once | Status: AC
  Administered 2021-11-19: 1
  Filled 2021-11-19: qty 1

## 2021-11-19 MED ORDER — ALBUTEROL SULFATE (2.5 MG/3ML) 0.083% IN NEBU
2.5000 mg | INHALATION_SOLUTION | RESPIRATORY_TRACT | Status: DC | PRN
  Administered 2021-11-19: 2.5 mg via RESPIRATORY_TRACT

## 2021-11-19 MED ORDER — ALBUTEROL SULFATE HFA 108 (90 BASE) MCG/ACT IN AERS
2.0000 | INHALATION_SPRAY | Freq: Once | RESPIRATORY_TRACT | Status: AC
  Administered 2021-11-19: 2 via RESPIRATORY_TRACT
  Filled 2021-11-19: qty 6.7

## 2021-11-19 MED ORDER — IOHEXOL 350 MG/ML SOLN
80.0000 mL | Freq: Once | INTRAVENOUS | Status: AC | PRN
  Administered 2021-11-19: 80 mL via INTRAVENOUS

## 2021-11-19 MED ORDER — IPRATROPIUM-ALBUTEROL 0.5-2.5 (3) MG/3ML IN SOLN
3.0000 mL | Freq: Once | RESPIRATORY_TRACT | Status: AC
  Administered 2021-11-19: 3 mL via RESPIRATORY_TRACT
  Filled 2021-11-19: qty 3

## 2021-11-19 MED ORDER — IPRATROPIUM-ALBUTEROL 0.5-2.5 (3) MG/3ML IN SOLN: 3.0000 mL | Freq: Once | RESPIRATORY_TRACT | Status: DC

## 2021-11-19 MED ORDER — SODIUM CHLORIDE 0.9 % IV BOLUS
500.0000 mL | Freq: Once | INTRAVENOUS | Status: AC
  Administered 2021-11-19: 500 mL via INTRAVENOUS

## 2021-11-19 NOTE — ED Notes (Signed)
Patient instructed on the se of MDI with spacer. Patient demonstrated good effort.

## 2021-11-19 NOTE — ED Notes (Signed)
Iv was done by triage nurse

## 2021-11-19 NOTE — ED Provider Notes (Signed)
[facility] EM[facility] DEPARTMENT Provider Note   CSN: [id number] Arrival date & time: 11/19/21  1513     History  Chief Complaint  Patient presents with   Chest Pain    Adrian Bernard is a 66 y.o. male w/ pmh of cigarette smoking and diabetes who presents with a cc of cp. He head onset of flulike symptoms beginning 1 week ago with patient thinks he got from his grandchildren.  He has had persistent coughing.  He has had worsening symptoms beginning yesterday including inability to sleep at night due to coughing, pain in his chest when he coughs or leans forward.  The pain is characterized as sharp.  He denies fevers but has generalized fatigue, headaches.  He denies fever, nausea, vomiting.  His cough is nonproductive he is up-to-date on flu vaccination.  Nothing seems to make his symptoms much better.  He tried using over-the-counter cough medications.  He denies exertional dyspnea.  He denies peripheral edema.   Chest Pain      Home Medications Prior to Admission medications   Medication Sig Start Date End Date Taking? Authorizing Provider  Ascorbic Acid (VITAMIN C) 500 MG CAPS Take by mouth daily at 6 (six) AM.    [provider]  aspirin 81 MG EC tablet Take 81 mg by mouth daily. Pt instructed to stop 81 asa 2 days before 04-25-2021 surgery by dr Fritzi Mandes    [provider]  buPROPion (WELLBUTRIN XL) 150 MG 24 hr tablet Take 150 mg by mouth daily.    [provider]  cephALEXin (KEFLEX) 500 MG capsule Take 1 capsule (500 mg total) by mouth 4 (four) times daily. 10/05/21   Suzy Bouchard, PA-C  Cyanocobalamin (VITAMIN B 12 PO) Take by mouth daily.    [provider]  empagliflozin (JARDIANCE) 25 MG TABS tablet Take by mouth daily.    [provider]  gabapentin (NEURONTIN) 300 MG capsule Take 300 mg by mouth at bedtime.    [provider]  ibuprofen (ADVIL) 800 MG tablet Take 800 mg by mouth every 8 (eight) hours  as needed.    [provider]  JANUVIA 100 MG tablet Take 100 mg by mouth daily.  02/23/16   [provider]  metFORMIN (GLUCOPHAGE) 1000 MG tablet Take 1,000 mg by mouth 2 (two) times daily. Take 1 tab in the morning and 1 tab in the evening     [provider]  Multiple Vitamins-Minerals (MULTIVITAMIN WITH MINERALS) tablet Take 1 tablet by mouth daily.    [provider]  omeprazole (PRILOSEC) 20 MG capsule Take 20 mg by mouth at bedtime.    [provider]  rosuvastatin (CRESTOR) 20 MG tablet Take 20 mg by mouth daily.    [provider]  Semaglutide, 1 MG/DOSE, (OZEMPIC, 1 MG/DOSE,) 4 MG/3ML SOPN Inject 4 mg into the skin once a week. friday    [provider]  tadalafil (CIALIS) 10 MG tablet Take 10 mg by mouth daily as needed for erectile dysfunction.    [provider]  VITAMIN D PO Take by mouth daily.    [provider]      Allergies    Patient has no known allergies.    Review of Systems   Review of Systems  Cardiovascular:  Positive for chest pain.    Physical Exam Updated Vital Signs BP 136/70 (BP Location: Left Arm)   Pulse (!) 103   Temp 98.2 F (36.8 C) (  Oral)   Resp 20   Ht '5\' 8"'$  (1.727 m)   Wt 68 kg   SpO2 97%   BMI 22.81 kg/m  Physical Exam Vitals and nursing note reviewed.  Constitutional:      General: He is not in acute distress.    Appearance: He is well-developed. He is not diaphoretic.  HENT:     Head: Normocephalic and atraumatic.  Eyes:     General: No scleral icterus.    Conjunctiva/sclera: Conjunctivae normal.  Cardiovascular:     Rate and Rhythm: Normal rate and regular rhythm.     Heart sounds: Normal heart sounds.  Pulmonary:     Effort: Pulmonary effort is normal. No respiratory distress.     Breath sounds: Wheezing present.     Comments: Bubbling sound noted on cough. No palpable crepitus Abdominal:     Palpations: Abdomen is soft.     Tenderness: There  is no abdominal tenderness.  Musculoskeletal:     Cervical back: Normal range of motion and neck supple.     Right lower leg: No edema.     Left lower leg: No edema.  Skin:    General: Skin is warm and dry.  Neurological:     Mental Status: He is alert.  Psychiatric:        Behavior: Behavior normal.     ED Results / Procedures / Treatments   Labs (all labs ordered are listed, but only abnormal results are displayed) Labs Reviewed  RESP PANEL BY RT-PCR (FLU A&B, COVID) [id number]  BASIC METABOLIC PANEL  CBC  TROPONIN I (HIGH SENSITIVITY)    EKG None  Radiology No results found.  Procedures Procedures    Medications Ordered in ED Medications - No data to display  ED Course/ Medical Decision Making/ A&P Clinical Course as of 11/19/21 1633  Sat Nov 19, 2021  7915 Basic metabolic panel(!) [AH]  0569 Glucose(!): 227 [AH]  1559 WBC(!): 13.4 [AH]  1559 EKG shows sinus tachycardia at a rate of 104 [AH]  1559 EKG 12-Lead [AH]  1559 DG Chest Portable 1 View I visualized and interpreted a 1 view chest x-ray which shows no acute findings [AH]  1631 I visualized and interpreted port 1 v cxr. Concern for sub Q air on left chest walll. I compared the film to prior study 1 month ago , it appears different. I discussed the case with Dr. Nyoka Cowden of Radiology. He feels the dark subq area is likely artifact. [AH]    Clinical Course User Index [AH] Margarita Mail, PA-C                           Medical Decision Making 66 year old male who presents emergency department with flulike symptoms and cough. Differential diagnosis for emergent cause of cough includes but is not limited to upper respiratory infection, lower respiratory infection, allergies, asthma, irritants, foreign body, medications such as ACE inhibitors, reflux, asthma, CHF, lung cancer, interstitial lung disease, psychiatric causes, postnasal drip and postinfectious bronchospasm.  COmplaint involves an extensive number of  treatment options, and is a complaint that carries with it a high risk of complications and morbidity.  T   Co morbidities that complicate the patient evaluation       Diabetes, chronic tobaCCO abuse,hypertension           Cardiac Monitoring:       The patient was maintained on a cardiac monitor.  I personally viewed and interpreted  the cardiac monitored which showed an underlying rhythm of: Sinus tachycardia   Medicines ordered and prescription drug management:  I ordered medication including fluids, magnesium for wheezing and shortness of breath Reevaluation of the patient after these medicines showed that the patient improved I have reviewed the patients home medicines and have made adjustments as needed     Consultations Obtained:  N/a   Problem List / ED Course:       (J18.9) Community acquired pneumonia, unspecified laterality  (primary encounter diagnosis)  (I71.21) Aneurysm of ascending aorta without rupture ([place])     Reevaluation:  After the interventions noted above, I reevaluated the patient and found that they have :improved   Social Determinants of Health:       Chronic smoker The patient was counseled on the dangers of tobacco use, and was advised to quit. Reviewed strategies to maximize success, including removing cigarettes and smoking materials from environment, stress management, substitution of other forms of reinforcement, support of family/friends and written materials.     Dispostion:  After consideration of the diagnostic results and the patients response to treatment, I feel that the patent would benefit from discharge with tx for CAP.  Patient will be given Rocephin and doxycycline, cough suppressant and albuterol.  Is likely a component of COPD given his long history of smoking.  No evidence of pneumothorax or ruptured bleb.  He does have an incidental finding of ascending thoracic aortic aneurysm of 4 cm.  He is given ambulatory  referral to cardiothoracic surgery and advised that if he has not heard from then he should call and make an appointment in the middle of the week to follow-up for monitoring.  I do not think that there is anything to do with his chest pain and cough.  Patient is negative for COVID and influenza and appears appropriate for discharge at this time without hypoxia or respiratory distress.    Amount and/or Complexity of Data Reviewed Independent Historian:     Details: Adult child at bedside Labs: ordered. Decision-making details documented in ED Course.    Details: I Ordered, and personally interpreted labs.  The pertinent results include: Elevated blood sugar likely acute phase reaction in the setting of respiratory infection Radiology: ordered and independent interpretation performed. Decision-making details documented in ED Course.    Details:  Imaging Studies ordered:  I ordered imaging studies including chest x-rays, CT angiogram of the chest I independently visualized and interpreted imaging which showed bilateral inflammatory versus infectious findings, incidental ascending thoracic aortic aneurysm I agree with the radiologist interpretation  ECG/medicine tests:  Decision-making details documented in ED Course.  Risk Prescription drug management. Risk Details: Smoking, incidental thoracic aortic aneurysm           Final Clinical Impression(s) / ED Diagnoses Final diagnoses:  Community acquired pneumonia, unspecified laterality  Aneurysm of ascending aorta without rupture [facility])    Rx / DC Orders ED Discharge Orders     None         Margarita Mail, PA-C 11/19/21 2033    Leanord Asal K, DO 11/20/21 0024

## 2021-11-19 NOTE — Discharge Instructions (Addendum)
Use your inhaler 1 to 2 puffs every 4-6 hours for coughing, wheezing and shortness of breath.  Make sure you are drinking plenty of water as this can help liquefy the secretions in your lungs and help you expectorate your sputum.  You may also take over-the-counter guaifenesin tablets or liquid which may help you to clear secretions from your lungs.  You will need to follow-up with cardiothoracic surgery for management and further evaluation of the aneurysm noted in your chest.  Contact a health care provider if: You have a fever. You have trouble sleeping because you cannot control your cough with cough medicine. Get help right away if: Your shortness of breath becomes worse. Your chest pain increases. Your sickness becomes worse, especially if you are an older adult or have a weak immune system. You cough up blood. If you have sudden onset tearing chest pain that radiates to your back, sudden onset of abnormal sensation or weakness on one side of your body.

## 2021-11-19 NOTE — ED Notes (Signed)
Pain since yesterday in his chest.coughing and headache for 6 days.

## 2021-11-19 NOTE — ED Triage Notes (Signed)
C/O chest pain along w/ cough for about a week now, stated he thinks he got sick from his grand kids. reported hx of DM and that's it. Smokes about a pack a day;

## 2021-11-26 ENCOUNTER — Emergency Department (HOSPITAL_BASED_OUTPATIENT_CLINIC_OR_DEPARTMENT_OTHER)
Admission: EM | Admit: 2021-11-26 | Discharge: 2021-11-26 | Disposition: A | Discharge status: Home / Self Care | Payer: Medicaid Other | Attending: Emergency Medicine | Admitting: Emergency Medicine

## 2021-11-26 ENCOUNTER — Encounter (HOSPITAL_BASED_OUTPATIENT_CLINIC_OR_DEPARTMENT_OTHER): Payer: Self-pay | Admitting: Emergency Medicine

## 2021-11-26 ENCOUNTER — Emergency Department (HOSPITAL_BASED_OUTPATIENT_CLINIC_OR_DEPARTMENT_OTHER): Payer: Medicaid Other

## 2021-11-26 ENCOUNTER — Other Ambulatory Visit: Payer: Self-pay

## 2021-11-26 DIAGNOSIS — S199XXA Unspecified injury of neck, initial encounter: Secondary | ICD-10-CM | POA: Diagnosis present

## 2021-11-26 DIAGNOSIS — Z7982 Long term (current) use of aspirin: Secondary | ICD-10-CM | POA: Diagnosis not present

## 2021-11-26 DIAGNOSIS — S161XXA Strain of muscle, fascia and tendon at neck level, initial encounter: Secondary | ICD-10-CM

## 2021-11-26 DIAGNOSIS — I10 Essential (primary) hypertension: Secondary | ICD-10-CM | POA: Insufficient documentation

## 2021-11-26 DIAGNOSIS — F17200 Nicotine dependence, unspecified, uncomplicated: Secondary | ICD-10-CM | POA: Diagnosis not present

## 2021-11-26 DIAGNOSIS — E1165 Type 2 diabetes mellitus with hyperglycemia: Secondary | ICD-10-CM | POA: Diagnosis not present

## 2021-11-26 DIAGNOSIS — Z7984 Long term (current) use of oral hypoglycemic drugs: Secondary | ICD-10-CM | POA: Diagnosis not present

## 2021-11-26 DIAGNOSIS — X500XXA Overexertion from strenuous movement or load, initial encounter: Secondary | ICD-10-CM | POA: Diagnosis not present

## 2021-11-26 DIAGNOSIS — R519 Headache, unspecified: Secondary | ICD-10-CM | POA: Diagnosis not present

## 2021-11-26 DIAGNOSIS — R739 Hyperglycemia, unspecified: Secondary | ICD-10-CM

## 2021-11-26 DIAGNOSIS — Z79899 Other long term (current) drug therapy: Secondary | ICD-10-CM | POA: Insufficient documentation

## 2021-11-26 LAB — COMPREHENSIVE METABOLIC PANEL
ALT: 32 U/L (ref 0–44)
AST: 22 U/L (ref 15–41)
Albumin: 3.9 g/dL (ref 3.5–5.0)
Alkaline Phosphatase: 76 U/L (ref 38–126)
Anion gap: 5 (ref 5–15)
BUN: 20 mg/dL (ref 8–23)
CO2: 28 mmol/L (ref 22–32)
Calcium: 9.2 mg/dL (ref 8.9–10.3)
Chloride: 103 mmol/L (ref 98–111)
Creatinine, Ser: 0.69 mg/dL (ref 0.61–1.24)
GFR, Estimated: >60 mL/min (ref >60)
Glucose, Bld: 188 mg/dL — ABNORMAL HIGH (ref 70–99)
Potassium: 4.3 mmol/L (ref 3.5–5.1)
Sodium: 136 mmol/L (ref 135–145)
Total Bilirubin: 0.3 mg/dL (ref 0.3–1.2)
Total Protein: 8.1 g/dL (ref 6.5–8.1)

## 2021-11-26 LAB — CBC WITH DIFFERENTIAL/PLATELET
Abs Immature Granulocytes: 0.05 10*3/uL (ref 0.00–0.07)
Basophils Absolute: 0.1 10*3/uL (ref 0.0–0.1)
Basophils Relative: 1 %
Eosinophils Absolute: 0.2 10*3/uL (ref 0.0–0.5)
Eosinophils Relative: 2 %
HCT: 41.5 % (ref 39.0–52.0)
Hemoglobin: 13.7 g/dL (ref 13.0–17.0)
Immature Granulocytes: 1 %
Lymphocytes Relative: 29 %
Lymphs Abs: 3.2 10*3/uL (ref 0.7–4.0)
MCH: 29.3 pg (ref 26.0–34.0)
MCHC: 33 g/dL (ref 30.0–36.0)
MCV: 88.9 fL (ref 80.0–100.0)
Monocytes Absolute: 0.9 10*3/uL (ref 0.1–1.0)
Monocytes Relative: 9 %
Neutro Abs: 6.3 10*3/uL (ref 1.7–7.7)
Neutrophils Relative %: 58 %
Platelets: 357 10*3/uL (ref 150–400)
RBC: 4.67 MIL/uL (ref 4.22–5.81)
RDW: 13 % (ref 11.5–15.5)
WBC: 10.8 10*3/uL — ABNORMAL HIGH (ref 4.0–10.5)
nRBC: 0 % (ref 0.0–0.2)

## 2021-11-26 MED ORDER — LIDOCAINE 4 % EX PTCH: 1.0000 | MEDICATED_PATCH | CUTANEOUS | 0 refills | Status: DC

## 2021-11-26 MED ORDER — HYDROMORPHONE HCL 1 MG/ML IJ SOLN
1.0000 mg | Freq: Once | INTRAMUSCULAR | Status: AC
  Administered 2021-11-26: 1 mg via INTRAVENOUS
  Filled 2021-11-26: qty 1

## 2021-11-26 MED ORDER — LIDOCAINE 5 % EX PTCH
1.0000 | MEDICATED_PATCH | CUTANEOUS | Status: DC
  Administered 2021-11-26: 1 via TRANSDERMAL
  Filled 2021-11-26: qty 1

## 2021-11-26 MED ORDER — TIZANIDINE HCL 4 MG PO TABS: 4.0000 mg | ORAL_TABLET | Freq: Three times a day (TID) | ORAL | 0 refills | Status: DC

## 2021-11-26 MED ORDER — IOHEXOL 300 MG/ML  SOLN
75.0000 mL | Freq: Once | INTRAMUSCULAR | Status: AC | PRN
  Administered 2021-11-26: 75 mL via INTRAVENOUS

## 2021-11-26 MED ORDER — DIAZEPAM 5 MG/ML IJ SOLN
5.0000 mg | Freq: Once | INTRAMUSCULAR | Status: AC
  Administered 2021-11-26: 5 mg via INTRAVENOUS
  Filled 2021-11-26: qty 2

## 2021-11-26 MED ORDER — FENTANYL CITRATE PF 50 MCG/ML IJ SOSY
50.0000 ug | PREFILLED_SYRINGE | Freq: Once | INTRAMUSCULAR | Status: AC
  Administered 2021-11-26: 50 ug via INTRAVENOUS
  Filled 2021-11-26: qty 1

## 2021-11-26 MED ORDER — OXYCODONE HCL 5 MG PO TABS: 5.0000 mg | ORAL_TABLET | ORAL | 0 refills | Status: DC | PRN

## 2021-11-26 NOTE — ED Notes (Signed)
Returned from CT.

## 2021-11-26 NOTE — ED Triage Notes (Signed)
Pt reports midline neck pain for the past 5 hours since he woke up. Pain worse with moving head up and down and side to side. Had a recent neck procedure to fix a cervical spine fusion but has not had any post-op issues until today. Recently treated for pneumonia; feel better than before.

## 2021-11-26 NOTE — ED Notes (Signed)
Family at bedside. 

## 2021-11-26 NOTE — ED Provider Notes (Signed)
[place] [facility] EMERGENCY DEPARTMENT Provider Note   CSN: [id number] Arrival date & time: 11/26/21  0856     History Chief Complaint  Patient presents with   Torticollis    Adrian Bernard is a 66 y.o. male with history of GERD, hypertension, type 2 diabetes, tobacco use presents the emergency department for evaluation of left-sided neck pain.  Patient reports that on 11-10-2021, he had a recent neck procedure done at [facility] in [place].  He does not remember what the procedure was, but does mention that there was a needle in his neck while he was sedated.  He reports that he woke up this morning with left-sided neck pain and pain upon moving his head.  Recently was diagnosed with pneumonia, but denies any fevers.  Son is at bedside denies any changes.  The son reports that the patient yesterday was hauling large pallets and drums and dumping them off at the [place] doing a lot of strenuous heavy lifting.  No pain medication was taken prior to arrival.  He denies any numbness or tingling into his extremities.  Denies any unilateral weakness.  No known drug allergies.  HPI     Home Medications Prior to Admission medications   Medication Sig Start Date End Date Taking? Authorizing Provider  lidocaine (HM LIDOCAINE PATCH) 4 % Place 1 patch onto the skin daily. 11/26/21  Yes Sherrell Puller, PA-C  oxyCODONE (ROXICODONE) 5 MG immediate release tablet Take 1 tablet (5 mg total) by mouth every 4 (four) hours as needed for severe pain. 11/26/21  Yes Sherrell Puller, PA-C  tiZANidine (ZANAFLEX) 4 MG tablet Take 1 tablet (4 mg total) by mouth 3 (three) times daily. 11/26/21  Yes Sherrell Puller, PA-C  Ascorbic Acid (VITAMIN C) 500 MG CAPS Take by mouth daily at 6 (six) AM.    [provider]  aspirin 81 MG EC tablet Take 81 mg by mouth daily. Pt instructed to stop 81 asa 2 days before 04-25-2021 surgery by dr Fritzi Mandes    [provider]  benzonatate (TESSALON) 100 MG capsule  Take 1 capsule (100 mg total) by mouth every 8 (eight) hours. 11/19/21   Harris, Vernie Shanks, PA-C  buPROPion (WELLBUTRIN XL) 150 MG 24 hr tablet Take 150 mg by mouth daily.    [provider]  cephALEXin (KEFLEX) 500 MG capsule Take 1 capsule (500 mg total) by mouth 4 (four) times daily. 10/05/21   Suzy Bouchard, PA-C  Cyanocobalamin (VITAMIN B 12 PO) Take by mouth daily.    [provider]  doxycycline (VIBRAMYCIN) 100 MG capsule Take 1 capsule (100 mg total) by mouth 2 (two) times daily. One po bid x 7 days 11/19/21   Margarita Mail, PA-C  empagliflozin (JARDIANCE) 25 MG TABS tablet Take by mouth daily.    [provider]  gabapentin (NEURONTIN) 300 MG capsule Take 300 mg by mouth at bedtime.    [provider]  ibuprofen (ADVIL) 800 MG tablet Take 800 mg by mouth every 8 (eight) hours as needed.    [provider]  JANUVIA 100 MG tablet Take 100 mg by mouth daily.  02/23/16   [provider]  metFORMIN (GLUCOPHAGE) 1000 MG tablet Take 1,000 mg by mouth 2 (two) times daily. Take 1 tab in the morning and 1 tab in the evening     [provider]  Multiple Vitamins-Minerals (MULTIVITAMIN WITH MINERALS) tablet Take 1 tablet by mouth daily.    [provider]  omeprazole ([place])  20 MG capsule Take 20 mg by mouth at bedtime.    [provider]  rosuvastatin (CRESTOR) 20 MG tablet Take 20 mg by mouth daily.    [provider]  Semaglutide, 1 MG/DOSE, (OZEMPIC, 1 MG/DOSE,) 4 MG/3ML SOPN Inject 4 mg into the skin once a week. friday    [provider]  tadalafil (CIALIS) 10 MG tablet Take 10 mg by mouth daily as needed for erectile dysfunction.    [provider]  VITAMIN D PO Take by mouth daily.    [provider]      Allergies    Patient has no known allergies.    Review of Systems   Review of Systems  Constitutional:  Negative for chills and fever.  Respiratory:  Negative for  shortness of breath.   Cardiovascular:  Negative for chest pain.  Musculoskeletal:  Positive for neck pain. Negative for back pain.  Neurological:  Positive for headaches. Negative for weakness, light-headedness and numbness.    Physical Exam Updated Vital Signs BP (!) 150/71   Pulse 79   Temp 97.9 F (36.6 C) (Oral)   Resp 15   SpO2 100%  Physical Exam Vitals and nursing note reviewed.  Constitutional:      General: He is not in acute distress.    Comments: Uncomfortable, but nontoxic-appearing  HENT:     Head: Normocephalic and atraumatic.     Mouth/Throat:     Mouth: Mucous membranes are moist.  Eyes:     General: No scleral icterus.    Extraocular Movements: Extraocular movements intact.  Neck:     Comments: Limited range of motion secondary to pain.  Patient does have some flexion and extension present in his neck, but again with some pain.  He has a palpable muscle spasm to the left side of his neck going into his upper shoulder, likely trapezius.  No overlying skin changes, erythema, warmth, or swelling noted to the area.  No palpable crepitus. Cardiovascular:     Rate and Rhythm: Normal rate.     Pulses: Normal pulses.     Comments: Pulses in the upper extremity are symmetric and equal.  Cap refill brisk.  Compartments are soft. Pulmonary:     Effort: Pulmonary effort is normal. No respiratory distress.  Musculoskeletal:     Cervical back: Tenderness present.  Neurological:     General: No focal deficit present.     Mental Status: He is alert.     Cranial Nerves: No cranial nerve deficit.     Sensory: No sensory deficit.     Comments: Patient is able to differentiate light touch with eyes closed.  Cranial nerves II through XII intact.  No pronator drift.  Strength is intact in patient's upper and lower bilateral extremities.     ED Results / Procedures / Treatments   Labs (all labs ordered are listed, but only abnormal results are displayed) Labs Reviewed  CBC  WITH DIFFERENTIAL/PLATELET - Abnormal; Notable for the following components:      Result Value   WBC 10.8 (*)    All other components within normal limits  COMPREHENSIVE METABOLIC PANEL - Abnormal; Notable for the following components:   Glucose, Bld 188 (*)    All other components within normal limits    EKG None  Radiology CT CERVICAL SPINE W CONTRAST  Result Date: 11/26/2021 CLINICAL DATA:  Headache.  Midline neck pain since waking up. EXAM: CT HEAD WITHOUT CONTRAST CT CERVICAL SPINE WITHOUT CONTRAST  TECHNIQUE: Multidetector CT imaging of the head and cervical spine was performed following the standard protocol without intravenous contrast. Multiplanar CT image reconstructions of the cervical spine were also generated. RADIATION DOSE REDUCTION: This exam was performed according to the departmental dose-optimization program which includes automated exposure control, adjustment of the mA and/or kV according to patient size and/or use of iterative reconstruction technique. COMPARISON:  None FINDINGS: CT HEAD FINDINGS Brain: No evidence of acute infarction, hemorrhage, hydrocephalus, extra-axial collection or mass lesion/mass effect. Vascular: No hyperdense vessel or unexpected calcification. Skull: Normal. Negative for fracture or focal lesion. Sinuses/Orbits: There is mucosal thickening involving the ethmoid air cells and frontal sinus. Mastoid air cells are clear. Other: None CT CERVICAL SPINE FINDINGS Alignment: Normal. Skull base and vertebrae: No acute fracture. No primary bone lesion or focal pathologic process. Soft tissues and spinal canal: No prevertebral fluid or swelling. No visible canal hematoma. Disc levels: Postop change from anterior cervical discectomy and interbody fusion at C3-4 and C4-5. There is mild disc space narrowing with endplate spurring noted at C5-6 and C6-7. Upper chest: Biapical pleuroparenchymal scarring noted. No acute abnormality. Other: None IMPRESSION: 1. No acute  intracranial abnormalities. 2. Mild sinus inflammation. 3. No evidence for cervical spine fracture or subluxation. 4. Postop change from anterior cervical discectomy and interbody fusion at C3-4 and C4-5. Electronically Signed   By: Kerby Moors M.D.   On: 11/26/2021 12:44   CT Head Wo Contrast  Result Date: 11/26/2021 CLINICAL DATA:  Headache.  Midline neck pain since waking up. EXAM: CT HEAD WITHOUT CONTRAST CT CERVICAL SPINE WITHOUT CONTRAST TECHNIQUE: Multidetector CT imaging of the head and cervical spine was performed following the standard protocol without intravenous contrast. Multiplanar CT image reconstructions of the cervical spine were also generated. RADIATION DOSE REDUCTION: This exam was performed according to the departmental dose-optimization program which includes automated exposure control, adjustment of the mA and/or kV according to patient size and/or use of iterative reconstruction technique. COMPARISON:  None FINDINGS: CT HEAD FINDINGS Brain: No evidence of acute infarction, hemorrhage, hydrocephalus, extra-axial collection or mass lesion/mass effect. Vascular: No hyperdense vessel or unexpected calcification. Skull: Normal. Negative for fracture or focal lesion. Sinuses/Orbits: There is mucosal thickening involving the ethmoid air cells and frontal sinus. Mastoid air cells are clear. Other: None CT CERVICAL SPINE FINDINGS Alignment: Normal. Skull base and vertebrae: No acute fracture. No primary bone lesion or focal pathologic process. Soft tissues and spinal canal: No prevertebral fluid or swelling. No visible canal hematoma. Disc levels: Postop change from anterior cervical discectomy and interbody fusion at C3-4 and C4-5. There is mild disc space narrowing with endplate spurring noted at C5-6 and C6-7. Upper chest: Biapical pleuroparenchymal scarring noted. No acute abnormality. Other: None IMPRESSION: 1. No acute intracranial abnormalities. 2. Mild sinus inflammation. 3. No  evidence for cervical spine fracture or subluxation. 4. Postop change from anterior cervical discectomy and interbody fusion at C3-4 and C4-5. Electronically Signed   By: Kerby Moors M.D.   On: 11/26/2021 12:44    Procedures Procedures   Medications Ordered in ED Medications  lidocaine (LIDODERM) 5 % 1 patch (1 patch Transdermal Patch Applied 11/26/21 1356)  diazepam (VALIUM) injection 5 mg (5 mg Intravenous Given 11/26/21 1042)  fentaNYL (SUBLIMAZE) injection 50 mcg (50 mcg Intravenous Given 11/26/21 1042)  HYDROmorphone (DILAUDID) injection 1 mg (1 mg Intravenous Given 11/26/21 1133)  iohexol (OMNIPAQUE) 300 MG/ML solution 75 mL (75 mLs Intravenous Contrast Given 11/26/21 1207)  HYDROmorphone (DILAUDID) injection 1 mg (  1 mg Intravenous Given 11/26/21 1358)    ED Course/ Medical Decision Making/ A&P                           Medical Decision Making Amount and/or Complexity of Data Reviewed Labs: ordered. Radiology: ordered.  Risk OTC drugs. Prescription drug management.   66 y/o M presents to the ED for evaluation of neck pain.  Differential diagnosis includes was not limited to cervical sprain, cervical strain, deep space infection, epidural abscess, musculoskeletal, chronic neck pain, meningitis.  Vital signs show elevated blood pressure 150/71 otherwise afebrile, normal pulse rate, satting well room air without increased work of breathing.  Physical exam as noted above.  On previous chart evaluation, on 11-10-2021, the patient had bilateral C4-C6 facet joint injections that was done at [place] in [place] [facility].  This procedure was done given he having moderate to severe neck to low back pain that was predominantly axial.  He had had the pain for greater than 6 weeks and has not responded to conservative therapy including physical therapy, home exercise program, medication management, rest, and passage of time- per the note.  Although patient does  not have signs of an infection, no tachycardia, no fever, will order CT cervical spine with contrast given that he did have recent injections.  We will also order basic labs.  I independently reviewed and interpreted the patient's labs.  Patient has a very minimal leukocytosis at 10.8 which is only 0.3 over the threshold.  No anemia.  CMP shows glucose at 188 although patient is known diabetic.  No other electrolyte or LFT abnormalities.  CT imaging shows 1. No acute intracranial abnormalities. 2. Mild sinus inflammation. 3. No evidence for cervical spine fracture or subluxation. 4. Postop change from anterior cervical discectomy and interbody fusion at C3-4 and C4-5.  Patient has been given 2 rounds of Dilaudid, fentanyl, Valium, and a lidocaine patch for pain.  He reports some pain relief, but still has pain on movement.  On my evaluation, his muscle spasm is still present, however is less firm.  Lidocaine patch overlying.  I do not think this patient has any meningitis.  He does not have any altered mental status, fever, or tachycardia.  No signs of infection.  His white blood cell count is minimally that 10.8.  He does not have any other signs of meningitis or acute infection.  Given his palpable muscle spasm, this is likely a cervical sprain or strain given his heavy lifting and moving that he did yesterday.  I do not think he has any need for additional MRI imaging, my attending agrees, given that the patient does not have any radiculopathy or any central cords symptoms.  We will send the patient home with muscle laxer's, narcotic pain medication, and lidocaine patches.  I discussed the imaging and labs with the patient and family member at bedside.  Discussed the medications to be given.  Recommended not driving or operate heavy treatment on this medication.  Discussed the need to follow-up with his neck surgeon.  We discussed return precautions for flag symptoms.  Patient and family verbalized  understanding and agreed to the plan.  Patient is stable be discharged home in good condition.  I discussed this case with my attending physician who cosigned this note including patient's presenting symptoms, physical exam, and planned diagnostics and interventions. Attending physician stated agreement with plan or made changes to plan which were  implemented.   Attending physician assessed patient at bedside.  Final Clinical Impression(s) / ED Diagnoses Final diagnoses:  Acute strain of neck muscle, initial encounter  Hyperglycemia    Rx / DC Orders ED Discharge Orders          Ordered    tiZANidine (ZANAFLEX) 4 MG tablet  3 times daily        11/26/21 1439    oxyCODONE (ROXICODONE) 5 MG immediate release tablet  Every 4 hours PRN        11/26/21 1439    lidocaine (HM LIDOCAINE PATCH) 4 %  Every 24 hours        11/26/21 1439              Sherrell Puller, PA-C 11/26/21 1856    Gareth Morgan, MD 11/29/21 1027

## 2021-11-26 NOTE — ED Notes (Signed)
Pt transported to CT ?

## 2021-11-26 NOTE — Discharge Instructions (Addendum)
You were seen in the emergency department for evaluation of your left-sided neck pain.  We have given you a little muscle relaxers, narcotic pain medication, and topical pain medication.  You will need to call your surgeon to follow-up given your increased pain.  This is likely a muscle spasm.  I will prescribe you tizanidine which is a muscle relaxer. Take as directed.  I will also send you home with a narcotic pain medications.  Please take these when Tylenol and I Profen is not working.  Additionally, I will send you home with a few lidocaine patches.  If your insurance does not cover these, you can pick these up over-the-counter for a much cheaper price.  Caution as it can make you sleepy or fatigued, so do not operate any vehicles.  If you have any concerns, new or worsening symptoms, please return to the nearest emergency department for evaluation.  Contact a doctor if: Your symptoms get worse. Your symptoms do not get better after 2 weeks of treatment. Your pain gets worse. Medicine does not help your pain. You have new symptoms that you cannot explain. Your neck collar gives you sores on your skin or bothers your skin. Get help right away if: You have very bad pain. You get any of the following in any part of your body: Loss of feeling. Tingling. Weakness. You cannot move a part of your body. You have neck pain and either of these: Very bad dizziness. A very bad headache.

## 2021-11-26 NOTE — ED Notes (Signed)
D/c paperwork reviewed with pt, including prescriptions and f/u care. Pt verbalized understanding, no questions or concerns at time of d/c. Ambulatory to ED exit with son, NAD.

## 2021-12-19 ENCOUNTER — Institutional Professional Consult (permissible substitution) (INDEPENDENT_AMBULATORY_CARE_PROVIDER_SITE_OTHER): Payer: Medicaid Other | Admitting: Physician Assistant

## 2021-12-19 VITALS — BP 117/51 | HR 85 | Resp 18 | Ht 68.0 in | Wt 150.0 lb

## 2021-12-19 DIAGNOSIS — I7121 Aneurysm of the ascending aorta, without rupture: Secondary | ICD-10-CM

## 2021-12-19 NOTE — Progress Notes (Signed)
[address]       [place] [address]             [phone]      PCP is Doreene Nest, Jannifer Rodney, MD Referring Provider is Margarita Mail, PA-C  Reason for consult: Evaluation and surveillance of a 4.0 cm thoracic aortic aneurysm.   HPI: Adrian Bernard is a 66 year old male with a past history of type 2 diabetes mellitus, dyslipidemia, and heavy tobacco use.  He has a past history of hypertension but has been on no medications for about 1 year after his blood pressure normalized after losing weight.  He smoked about 3 packs/day for nearly 40 years but is down to just 1 pack daily now.  He was seen in the emergency room on 11/19/2021 for evaluation of chest pain and flulike symptoms.  Work-up included a CTA of the chest that showed a 4.0 cm ascending aortic aneurysm.  He has been referred to [place] for follow-up and surveillance.  Past Medical History:  Diagnosis Date   Arthritis    fingers of both hands   GERD (gastroesophageal reflux disease)    High cholesterol    History of COVID-19 12/29/2020   mild symptoms all symptoms resolved   History of hypertension    no meds x 1 year since weight loss per pt on 04-21-2021   Lower back pain    Restless legs syndrome    Type 2 Diabetes mellitus without complication (HCC)    Vitamin D deficiency    Wears denture upper is implants hard to remove per pt    Wears partial denture lower     Past Surgical History:  Procedure Laterality Date   cervical spinal anterior discectomy with fusion C 3 to C 5  06/03/2020   done @ [facility]   dental implants     2020    Family History  Problem Relation Age of Onset   Heart disease Father    Diabetes Brother    Heart disease Brother    Diabetes Brother    Heart disease Brother    Colon cancer Neg Hx     Social History Social History   Tobacco Use   Smoking status: Every Day    Packs/day: 1.00    Years: 40.00    Total pack years: 40.00    Types: Cigarettes    Smokeless tobacco: Never  Vaping Use   Vaping Use: Never used  Substance Use Topics   Alcohol use: Never   Drug use: Never    Current Outpatient Medications  Medication Sig Dispense Refill   Ascorbic Acid (VITAMIN C) 500 MG CAPS Take by mouth daily at 6 (six) AM.     aspirin 81 MG EC tablet Take 81 mg by mouth daily. Pt instructed to stop 81 asa 2 days before 04-25-2021 surgery by dr Fritzi Mandes     benzonatate (TESSALON) 100 MG capsule Take 1 capsule (100 mg total) by mouth every 8 (eight) hours. 21 capsule 0   buPROPion (WELLBUTRIN XL) 150 MG 24 hr tablet Take 150 mg by mouth daily.     cephALEXin (KEFLEX) 500 MG capsule Take 1 capsule (500 mg total) by mouth 4 (four) times daily. 20 capsule 0   Cyanocobalamin (VITAMIN B 12 PO) Take by mouth daily.     doxycycline (VIBRAMYCIN) 100 MG capsule Take 1 capsule (100 mg total) by mouth 2 (two) times daily. One po bid x 7 days 14 capsule 0  empagliflozin (JARDIANCE) 25 MG TABS tablet Take by mouth daily.     gabapentin (NEURONTIN) 300 MG capsule Take 300 mg by mouth at bedtime.     ibuprofen (ADVIL) 800 MG tablet Take 800 mg by mouth every 8 (eight) hours as needed.     JANUVIA 100 MG tablet Take 100 mg by mouth daily.   3   lidocaine (HM LIDOCAINE PATCH) 4 % Place 1 patch onto the skin daily. 15 patch 0   metFORMIN (GLUCOPHAGE) 1000 MG tablet Take 1,000 mg by mouth 2 (two) times daily. Take 1 tab in the morning and 1 tab in the evening      Multiple Vitamins-Minerals (MULTIVITAMIN WITH MINERALS) tablet Take 1 tablet by mouth daily.     omeprazole (PRILOSEC) 20 MG capsule Take 20 mg by mouth at bedtime.     oxyCODONE (ROXICODONE) 5 MG immediate release tablet Take 1 tablet (5 mg total) by mouth every 4 (four) hours as needed for severe pain. 10 tablet 0   rosuvastatin (CRESTOR) 20 MG tablet Take 20 mg by mouth daily.     Semaglutide, 1 MG/DOSE, (OZEMPIC, 1 MG/DOSE,) 4 MG/3ML SOPN Inject 4 mg into the skin once a week. friday     tadalafil (CIALIS)  10 MG tablet Take 10 mg by mouth daily as needed for erectile dysfunction.     tiZANidine (ZANAFLEX) 4 MG tablet Take 1 tablet (4 mg total) by mouth 3 (three) times daily. 30 tablet 0   VITAMIN D PO Take by mouth daily.     No current facility-administered medications for this visit.    No Known Allergies  Review of Systems: Review of Systems  Constitutional: Negative.   HENT: Negative.    Eyes: Negative.   Respiratory: Negative.    Cardiovascular: Negative.   Gastrointestinal: Negative.   Genitourinary: Negative.   Musculoskeletal:  Positive for joint pain.  Skin: Negative.   Neurological: Negative.   Endo/Heme/Allergies: Negative.   Psychiatric/Behavioral: Negative.        Physical Exam: Vital signs: BP 117/51 Pulse 85 Respirations 18 SPO2 98% on room air  General: Pleasant thin male in no acute distress. HEENT: Unremarkable.  He has dental implants. Neck no carotid bruits.  Trachea is midline. Chest: Symmetrical, breath sounds are clear to auscultation. Heart: Regular rate and rhythm without murmur. Abdomen: Soft, nontender Extremities: Mild changes of arthritis of the DIP joints on his hands.  All are well-perfused. Neuro: Grossly nonfocal  Diagnostic Tests: Narrative & Impression  CLINICAL DATA:  Chest pain. Fever, chills, cough.   EXAM: PORTABLE CHEST 1 VIEW   COMPARISON:  Radiograph 10/05/2021   FINDINGS: The cardiomediastinal contours are normal. Mild peribronchial thickening. Biapical pleuroparenchymal scarring. Pulmonary vasculature is normal. No consolidation, pleural effusion, or pneumothorax. No acute osseous abnormalities are seen.   IMPRESSION: Mild peribronchial thickening, can be seen with smoking, bronchitis or asthma.     Electronically Signed   By: Keith Rake M.D.   On: 11/19/2021 15:45   CLINICAL DATA:  Chest pain, cough, diabetes   EXAM: CT ANGIOGRAPHY CHEST WITH CONTRAST   TECHNIQUE: Multidetector CT imaging of the  chest was performed using the standard protocol during bolus administration of intravenous contrast. Multiplanar CT image reconstructions and MIPs were obtained to evaluate the vascular anatomy.   RADIATION DOSE REDUCTION: This exam was performed according to the departmental dose-optimization program which includes automated exposure control, adjustment of the mA and/or kV according to patient size and/or use of iterative reconstruction technique.  CONTRAST:  35m OMNIPAQUE IOHEXOL 350 MG/ML SOLN   COMPARISON:  None Available.   FINDINGS: Cardiovascular: Heart size normal. No pericardial effusion. The RV is nondilated. Satisfactory opacification of pulmonary arteries noted, and there is no evidence of pulmonary emboli. Scattered coronary calcifications. Good contrast opacification of the thoracic aorta. Ascending segment measures up to 4 cm diameter, with atheromatous calcifications. Proximal arch 3.2 cm, descending segment 2.4 cm. No dissection, or stenosis.   Mediastinum/Nodes: No hematoma, mass or adenopathy.   Lungs/Pleura: No pleural effusion. No pneumothorax. Peripheral clustered nodular airspace opacities in the right middle lobe and lingula, predominantly inferiorly. Upper lobes are clear.   Upper Abdomen: No acute findings.   Musculoskeletal: No chest wall abnormality. No acute or significant osseous findings.   Review of the MIP images confirms the above findings.   IMPRESSION: 1. Negative for acute PE or thoracic aortic dissection. 2. Clustered nodular airspace opacities in the right middle lobe and lingula, likely infectious/inflammatory. 3. 4 cm ascending thoracic aortic aneurysm. Recommend annual imaging followup by CTA or MRA. This recommendation follows 2010 ACCF/AHA/AATS/ACR/ASA/SCA/SCAI/SIR/STS/SVM Guidelines for the Diagnosis and Management of Patients with Thoracic Aortic Disease. Circulation. 2010; 121: [id number]. Coronary and Aortic  Atherosclerosis (ICD10-I70.0).     Electronically Signed   By: DLucrezia EuropeM.D.   On: 11/19/2021 17:16  Impression / Plan: 66year old male with a 4.0 cm asymptomatic aortic aneurysm incidentally identified while undergoing evaluation for chest pain and flulike symptoms last month.  There is no family history of aneurysmal disease.  He does have a history of hypertension but was able to stop his medications about 35 pounds earlier this year.  He change his dietary habits was also able to decrease his hemoglobin A1c from 12 down to 6.7.  We discussed the rationale for going surveillance of the thoracic aortic aneurysm..Marland Kitchen Recommend follow-up in 1 year with CTA.  He is not had an echocardiogram so we will plan to this done prior to the next visit as well. We discussed the importance of avoiding prolonged strenuous activities and careful control of blood pressure.  He should stop smoking to minimize further risks of vascular disease.  We discussed the importance of avoiding fluoroquinolones disease antibiotics have been shown to weaken connective tissue.  Continue on statin therapy as he is currently prescribed.   MAntony Odea PA-C [facility] [phone]

## 2021-12-19 NOTE — Patient Instructions (Signed)
Avoid prolonged strenuous activity with lifting no more than 40 pounds.  Avoid the fluoroquinolone class of antibiotics such as Cipro and Levaquin  Continue careful management of your blood pressure as you are doing.  Recommend that you stop smoking to minimize the risk of vascular disease.  Follow-up in 1 year with chest CTA and echocardiogram.

## 2022-09-23 ENCOUNTER — Emergency Department (HOSPITAL_BASED_OUTPATIENT_CLINIC_OR_DEPARTMENT_OTHER): Payer: 59

## 2022-09-23 ENCOUNTER — Encounter (HOSPITAL_BASED_OUTPATIENT_CLINIC_OR_DEPARTMENT_OTHER): Payer: Self-pay

## 2022-09-23 ENCOUNTER — Emergency Department (HOSPITAL_BASED_OUTPATIENT_CLINIC_OR_DEPARTMENT_OTHER)
Admission: EM | Admit: 2022-09-23 | Discharge: 2022-09-23 | Disposition: A | Discharge status: Home / Self Care | Payer: 59 | Source: Home / Self Care | Attending: Emergency Medicine | Admitting: Emergency Medicine

## 2022-09-23 DIAGNOSIS — R202 Paresthesia of skin: Secondary | ICD-10-CM | POA: Insufficient documentation

## 2022-09-23 DIAGNOSIS — U071 COVID-19: Secondary | ICD-10-CM | POA: Diagnosis not present

## 2022-09-23 DIAGNOSIS — Z7984 Long term (current) use of oral hypoglycemic drugs: Secondary | ICD-10-CM | POA: Diagnosis not present

## 2022-09-23 DIAGNOSIS — I10 Essential (primary) hypertension: Secondary | ICD-10-CM | POA: Diagnosis not present

## 2022-09-23 DIAGNOSIS — R0789 Other chest pain: Secondary | ICD-10-CM | POA: Insufficient documentation

## 2022-09-23 DIAGNOSIS — M542 Cervicalgia: Secondary | ICD-10-CM | POA: Insufficient documentation

## 2022-09-23 DIAGNOSIS — G8929 Other chronic pain: Secondary | ICD-10-CM | POA: Diagnosis not present

## 2022-09-23 DIAGNOSIS — E119 Type 2 diabetes mellitus without complications: Secondary | ICD-10-CM | POA: Insufficient documentation

## 2022-09-23 DIAGNOSIS — Z7982 Long term (current) use of aspirin: Secondary | ICD-10-CM | POA: Insufficient documentation

## 2022-09-23 LAB — BASIC METABOLIC PANEL
Anion gap: 11 (ref 5–15)
BUN: 16 mg/dL (ref 8–23)
CO2: 23 mmol/L (ref 22–32)
Calcium: 8.3 mg/dL — ABNORMAL LOW (ref 8.9–10.3)
Chloride: 100 mmol/L (ref 98–111)
Creatinine, Ser: 0.98 mg/dL (ref 0.61–1.24)
GFR, Estimated: >60 mL/min (ref >60)
Glucose, Bld: 147 mg/dL — ABNORMAL HIGH (ref 70–99)
Potassium: 4.1 mmol/L (ref 3.5–5.1)
Sodium: 134 mmol/L — ABNORMAL LOW (ref 135–145)

## 2022-09-23 LAB — CBC
HCT: 39.3 % (ref 39.0–52.0)
Hemoglobin: 13 g/dL (ref 13.0–17.0)
MCH: 29.5 pg (ref 26.0–34.0)
MCHC: 33.1 g/dL (ref 30.0–36.0)
MCV: 89.1 fL (ref 80.0–100.0)
Platelets: 217 10*3/uL (ref 150–400)
RBC: 4.41 MIL/uL (ref 4.22–5.81)
RDW: 13.2 % (ref 11.5–15.5)
WBC: 9.6 10*3/uL (ref 4.0–10.5)
nRBC: 0 % (ref 0.0–0.2)

## 2022-09-23 LAB — TROPONIN I (HIGH SENSITIVITY)
Troponin I (High Sensitivity): 3 ng/L (ref <18)
Troponin I (High Sensitivity): 3 ng/L (ref <18)

## 2022-09-23 LAB — PROTIME-INR
INR: 1 (ref 0.8–1.2)
Prothrombin Time: 13.2 seconds (ref 11.4–15.2)

## 2022-09-23 LAB — SARS CORONAVIRUS 2 BY RT PCR: SARS Coronavirus 2 by RT PCR: POSITIVE — AB

## 2022-09-23 MED ORDER — LIDOCAINE 5 % EX PTCH
1.0000 | MEDICATED_PATCH | CUTANEOUS | Status: DC
  Administered 2022-09-23: 1 via TRANSDERMAL
  Filled 2022-09-23: qty 1

## 2022-09-23 MED ORDER — LIDOCAINE 5 % EX PTCH: 1.0000 | MEDICATED_PATCH | CUTANEOUS | 0 refills | Status: DC

## 2022-09-23 MED ORDER — HYDROCODONE-ACETAMINOPHEN 5-325 MG PO TABS: 1.0000 | ORAL_TABLET | Freq: Four times a day (QID) | ORAL | 0 refills | Status: AC | PRN

## 2022-09-23 MED ORDER — HYDROCODONE-ACETAMINOPHEN 5-325 MG PO TABS
1.0000 | ORAL_TABLET | Freq: Once | ORAL | Status: AC
  Administered 2022-09-23: 1 via ORAL
  Filled 2022-09-23: qty 1

## 2022-09-23 NOTE — ED Triage Notes (Signed)
The patient having left side chest pain since 2 am. He is also having numbness to both hands and left leg that was there when he woke up at 2am. He was normal last at 2100 last night. He denied fever, shortness of breath. Pt is coughing in triage.

## 2022-09-23 NOTE — Discharge Instructions (Signed)
Please follow-up with your spine specialist in regards to recent symptoms and ER visit.  You need to call them to schedule a steroid injection as the previous one is wearing off.  In the meantime I have prescribed you Lidoderm patches along with Norco.  You may use Tylenol every 6 hours needed for pain however if pain is not controlled by the Tylenol you may use the Norco.  You also tested positive for COVID.  After shared decision making we agreed to forego Paxlovid at this time and so you may take Tylenol for this as well and remain hydrated and eat food as tolerated.  If symptoms change or worsen please return to ER.

## 2022-09-23 NOTE — ED Provider Notes (Signed)
[facility] Provider Note   CSN: [id number] Arrival date & time: 09/23/22  1417     History  Chief Complaint  Patient presents with   Chest Pain   Numbness    Adrian Bernard is a 67 y.o. 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  Patient states this feels similar to episodes had in the past including the chest pain.  Patient states he had a nonproductive cough as well but denies any fevers, shortness of breath, hemoptysis, headache, vision changes, nausea/vomiting, abdominal pain.  Patient states that he gets steroid injections in his neck which seem to alleviate his polyneuropathy.  Patient does note he had a sick contact through work who may have COVID.  Patient denies any recent trauma, falls.  Home Medications Prior to Admission medications   Medication Sig Start Date End Date Taking? Authorizing Provider  HYDROcodone-acetaminophen (NORCO) 5-325 MG tablet Take 1 tablet by mouth every 6 (six) hours as needed for up to 5 days for moderate pain. 09/23/22 09/28/22 Yes Nan Maya, Beverly Gust, PA-C  lidocaine (LIDODERM) 5 % Place 1 patch onto the skin daily. Remove & Discard patch within 12 hours or as directed by MD 09/23/22  Yes Lauria Depoy, Beverly Gust, PA-C  Ascorbic Acid (VITAMIN C) 500 MG CAPS Take by mouth daily at 6 (six) AM.    [provider]  aspirin 81 MG EC tablet Take 81 mg by mouth daily. Pt instructed to stop 81 asa 2 days before 04-25-2021 surgery by dr Harriet Pho    [provider]  benzonatate (TESSALON) 100 MG capsule Take 1 capsule (100 mg total) by mouth every 8 (eight) hours. 11/19/21   Harris, Cammy Copa, PA-C  buPROPion (WELLBUTRIN XL) 150 MG 24 hr tablet Take 150 mg by mouth daily.    [provider]  cephALEXin  (KEFLEX) 500 MG capsule Take 1 capsule (500 mg total) by mouth 4 (four) times daily. 10/05/21   Mannie Stabile, PA-C  Cyanocobalamin (VITAMIN B 12 PO) Take by mouth daily.    [provider]  doxycycline (VIBRAMYCIN) 100 MG capsule Take 1 capsule (100 mg total) by mouth 2 (two) times daily. One po bid x 7 days 11/19/21   Arthor Captain, PA-C  empagliflozin (JARDIANCE) 25 MG TABS tablet Take by mouth daily.    [provider]  gabapentin (NEURONTIN) 300 MG capsule Take 300 mg by mouth at bedtime.    [provider]  ibuprofen (ADVIL) 800 MG tablet Take 800 mg by mouth every 8 (eight) hours as needed.    [provider]  JANUVIA 100 MG tablet Take 100 mg by mouth daily.  02/23/16   [provider]  lidocaine (HM LIDOCAINE PATCH) 4 % Place 1 patch onto the skin daily. 11/26/21   Achille Rich, PA-C  metFORMIN (GLUCOPHAGE) 1000 MG tablet Take 1,000 mg by mouth 2 (two) times daily. Take 1 tab in the morning and 1 tab in the evening     [provider]  Multiple Vitamins-Minerals (MULTIVITAMIN WITH MINERALS) tablet Take 1 tablet by mouth daily.    [provider]  Omega-3 Fatty Acids (OMEGA 3 500 PO) Take by mouth.    [provider]  omeprazole (PRILOSEC) 20 MG capsule Take 20 mg by mouth at bedtime.  [provider]  rosuvastatin (CRESTOR) 20 MG tablet Take 20 mg by mouth daily.    [provider]  Semaglutide, 1 MG/DOSE, (OZEMPIC, 1 MG/DOSE,) 4 MG/3ML SOPN Inject 4 mg into the skin once a week. friday    [provider]  tadalafil (CIALIS) 10 MG tablet Take 10 mg by mouth daily as needed for erectile dysfunction.    [provider]  tiZANidine (ZANAFLEX) 4 MG tablet Take 1 tablet (4 mg total) by mouth 3 (three) times daily. 11/26/21   Achille Rich, PA-C  VITAMIN D PO Take by mouth daily.    [provider]      Allergies    Patient has no known allergies.    Review of Systems    Review of Systems  Cardiovascular:  Positive for chest pain.    Physical Exam Updated Vital Signs BP 118/63 (BP Location: Right Arm)   Pulse 79   Temp 97.8 F (36.6 C) (Oral)   Resp (!) 26   Ht 5\' 8"  (1.727 m)   Wt 65 kg   SpO2 95%   BMI 21.77 kg/m  Physical Exam Vitals reviewed.  Constitutional:      General: He is not in acute distress. HENT:     Head: Normocephalic and atraumatic.  Eyes:     Extraocular Movements: Extraocular movements intact.     Conjunctiva/sclera: Conjunctivae normal.     Pupils: Pupils are equal, round, and reactive to light.  Cardiovascular:     Rate and Rhythm: Normal rate and regular rhythm.     Pulses: Normal pulses.     Heart sounds: Normal heart sounds.     Comments: 2+ bilateral radial/dorsalis pedis pulses with regular rate Pulmonary:     Effort: Pulmonary effort is normal. No respiratory distress.     Breath sounds: Normal breath sounds.     Comments: Nonproductive cough Abdominal:     Palpations: Abdomen is soft.     Tenderness: There is no abdominal tenderness. There is no guarding or rebound.  Musculoskeletal:        General: Normal range of motion.     Cervical back: Normal range of motion and neck supple.     Comments: 5 out of 5 bilateral grip/leg extension strength Not reproducible with palpation  Skin:    General: Skin is warm and dry.     Capillary Refill: Capillary refill takes less than 2 seconds.  Neurological:     General: No focal deficit present.     Mental Status: He is alert and oriented to person, place, and time.     Sensory: Sensation is intact.     Motor: Motor function is intact.     Coordination: Coordination is intact.     Comments: Sensation intact in all 4 limbs Vision grossly intact Cranial nerves III through XII intact  Psychiatric:        Mood and Affect: Mood normal.     ED Results / Procedures / Treatments   Labs (all labs ordered are listed, but only abnormal results are displayed) Labs  Reviewed  SARS CORONAVIRUS 2 BY RT PCR - Abnormal; Notable for the following components:      Result Value   SARS Coronavirus 2 by RT PCR POSITIVE (*)    All other components within normal limits  BASIC METABOLIC PANEL - Abnormal; Notable for the following components:   Sodium 134 (*)    Glucose, Bld 147 (*)    Calcium 8.3 (*)  All other components within normal limits  CBC  PROTIME-INR  TROPONIN I (HIGH SENSITIVITY)  TROPONIN I (HIGH SENSITIVITY)    EKG EKG Interpretation Date/Time:  Saturday September 23 2022 14:26:48 EDT Ventricular Rate:  79 PR Interval:  181 QRS Duration:  90 QT Interval:  352 QTC Calculation: 404 R Axis:   42  Text Interpretation: Sinus rhythm Abnormal R-wave progression, early transition No acute changes Confirmed by Drema Pry [phone]) on 09/23/2022 4:24:49 PM  Radiology CT Cervical Spine Wo Contrast  Result Date: 09/23/2022 CLINICAL DATA:  Neck pain with paresthesias in all 4 extremities. Prior neck surgery. Midsternal pain. EXAM: CT CERVICAL SPINE WITHOUT CONTRAST TECHNIQUE: Multidetector CT imaging of the cervical spine was performed without intravenous contrast. Multiplanar CT image reconstructions were also generated. RADIATION DOSE REDUCTION: This exam was performed according to the departmental dose-optimization program which includes automated exposure control, adjustment of the mA and/or kV according to patient size and/or use of iterative reconstruction technique. COMPARISON:  Cervical spine CT 11/18/2021 FINDINGS: Alignment: Unchanged trace anterolisthesis of C3 on C4. Mild cervical dextroscoliosis and upper thoracic levoscoliosis. Skull base and vertebrae: No acute fracture or suspicious osseous lesion. Prior C3-4 and C4-5 ACDF with some solid bridging bone at both levels. Similar appearance of multilevel degenerative endplate sclerosis. Moderate disc space narrowing at C5-6 and moderate to severe narrowing at C6-7. Soft tissues and spinal canal:  No prevertebral fluid or swelling. No visible canal hematoma. Disc levels: C2-3: A small central disc protrusion results in mild spinal stenosis, similar to the prior study. Mild uncovertebral spurring and right facet arthrosis without evidence of significant neural foraminal stenosis. C3-4: ACDF. Osteophytic ridging and mild facet arthrosis result in mild residual spinal stenosis and mild-to-moderate left greater than right neural foraminal stenosis, stable to mildly progressed. C4-5: ACDF. Osteophytic ridging mild facet arthrosis result in mild residual spinal stenosis and moderate to severe left greater than right neural foraminal stenosis, similar to prior. C5-6: Disc bulging, uncovertebral spurring, and moderate facet arthrosis result in moderate bilateral neural foraminal stenosis, mildly progressed. No evidence of significant spinal stenosis. C6-7: A broad-based posterior disc osteophyte complex and mild facet arthrosis result in progressive, severe right neural foraminal stenosis without evidence of significant spinal stenosis. C7-T1: Moderate facet arthrosis without evidence of significant stenosis. Upper chest: Biapical pleuroparenchymal lung scarring. Other: None. IMPRESSION: 1. No acute osseous abnormality. 2. 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 3. ACDF at C3-4 and C4-5 with residual spinal and neural foraminal stenosis as above. 4. Mild spinal stenosis at C2-3. Electronically Signed   By: Sebastian Ache M.D.   On: 09/23/2022 16:36   DG Chest 2 View  Result Date: 09/23/2022 CLINICAL DATA:  Midsternal chest pain since 2 a.m. EXAM: CHEST - 2 VIEW COMPARISON:  None Available. FINDINGS: The heart size is normal. Emphysematous changes are noted. No focal airspace disease is present. The visualized soft tissues and bony thorax are unremarkable. IMPRESSION: 1. No acute cardiopulmonary disease. 2. Emphysema. Electronically Signed    By: Marin Roberts M.D.   On: 09/23/2022 15:48    Procedures Procedures    Medications Ordered in ED Medications  HYDROcodone-acetaminophen (NORCO/VICODIN) 5-325 MG per tablet 1 tablet (has no administration in time range)  lidocaine (LIDODERM) 5 % 1 patch (has no administration in time range)    ED Course/ Medical Decision Making/ A&P  Medical Decision Making Amount and/or Complexity of Data Reviewed Labs: ordered. Radiology: ordered.   Kaveon I Gashi 67 y.o. presented today for chest pain. Working DDx that I considered at this time includes, but not limited to, chronic neck pain exacerbation, COVID, ACS, GERD, pulmonary embolism, community-acquired pneumonia, aortic dissection, pneumothorax, underlying bony abnormality, anemia, thyrotoxicosis, esophageal rupture, CHF exacerbation, valvular disorder, myocarditis, pericarditis, endocarditis, pericardial effusion/cardiac tamponade, pulmonary edema, gastritis/PUD, esophagitis.  R/o Dx: ACS, GERD, pulmonary embolism, community-acquired pneumonia, aortic dissection, pneumothorax, underlying bony abnormality, anemia, thyrotoxicosis, esophageal rupture, CHF exacerbation, valvular disorder, myocarditis, pericarditis, endocarditis, pericardial effusion/cardiac tamponade, pulmonary edema, gastritis/PUD, esophagitis: These are considered less likely due to history of present illness and physical exam findings. Aortic Dissection: less likely based on the location, quality, onset, and severity of symptoms in this case. Patient also has a lack of underlying history of AD or TAA.   Review of prior external notes: 10/17/2021 office visit  Unique Tests and My Interpretation:  EKG: Rate, rhythm, axis, intervals all examined and without medically relevant abnormality. ST segments without concerns for elevations Troponin: 3, 3 CXR: No acute changes CBC: Unremarkable BMP: Unremarkable PT/INR: Unremarkable CT  cervical spine without contrast: Progressive stenosis at C6-C7 with moderate stenosis at C5-C6  Discussion with Independent Historian:  Son  Discussion of Management of Tests: None  Risk: Medium: prescription drug management  Risk Stratification Score: None  Staffed with [place], MD  Plan: On exam patient was in no acute distress with stable vitals. Patient's physical was unremarkable. Labs and CXR will be ordered.  Suspect patient may have COVID exacerbating his symptoms as he did have a sick contact and will add on COVID swab.  Patient did not have any midline tenderness or new symptoms concerning the C-spine however will obtain CT C-spine to ensure there have not been any progressive changes.  PT/INR was ordered from triage however patient is not on warfarin/Coumadin and does not have any liver disorders or abdominal pain or scleral icterus/jaundice indicating need for PT/INR.  Patient stable at this time.  Patient's labs came back positive for COVID.  When using the [facility] COVID-19 interaction website there does not appear to be any contraindications with patient's current medications.  Patient is elderly and symptoms did began last night and so spoke to the patient about the possibility of Paxlovid if the rest of his workup is reassuring we discharged home.  Patient was given the risks and benefits of Paxlovid and with full decision-making capacity decided to not be prescribed the medicine.  I suspect patient's left-sided chest pain is due to his COVID as he is also having a cough as well in conjunction with his reassuring workup.  Patient's troponins are negative and his CT scan shows progressive stenosis at the C6-C7 along with stenosis at C5-C6 which would contribute to patient's paresthesias that he has chronically.  On repeat exam patient had good pulses motor sensation all 4 extremities and I discussed with him the importance of following up with his spine specialist to have a steroid  injection as that would be the best option for his pain.  We had shared decision making and I agreed to prescribe patient Norco in the meantime until he is able to see a spine specialist due to patient's pain.  I discussed the side effects of this medication and risks and patient verbalizes understanding of these risks and that he still wanted the medication.  I will also prescribe Lidoderm patches as well for topical relief.  Encouraged patient to remain hydrated and eat food as tolerated in regards to his COVID and to use Tylenol every 6 hours as needed for pain for his neck along with his COVID symptoms and to follow-up with his primary care provider in a week if he is still having COVID symptoms.  At patient's request he requested pain meds before being discharged and will be given 1 Norco and Lidoderm patch as he has his son driving him.  Patient was given return precautions. Patient stable for discharge at this time.  Patient verbalized understanding of plan.         Final Clinical Impression(s) / ED Diagnoses Final diagnoses:  Chronic neck pain  COVID    Rx / DC Orders ED Discharge Orders          Ordered    HYDROcodone-acetaminophen (NORCO) 5-325 MG tablet  Every 6 hours PRN        09/23/22 1658    lidocaine (LIDODERM) 5 %  Every 24 hours        09/23/22 1658              Remi Deter 09/23/22 1703    Nira Conn, MD 09/24/22 1400

## 2022-09-23 NOTE — ED Notes (Signed)
Pt has returned to their room 

## 2022-09-23 NOTE — ED Notes (Signed)
Cannot discharge, registration in chart.   

## 2022-09-23 NOTE — ED Notes (Signed)
Discharge paperwork reviewed entirely with patient, including follow up care. Pain was under control. The patient received instruction and coaching on their prescriptions, and all follow-up questions were answered.  Pt verbalized understanding as well as all parties involved. No questions or concerns voiced at the time of discharge. No acute distress noted.   Pt ambulated out to PVA without incident or assistance.  

## 2022-11-08 ENCOUNTER — Other Ambulatory Visit: Payer: Self-pay | Admitting: Thoracic Surgery (Cardiothoracic Vascular Surgery)

## 2022-11-08 DIAGNOSIS — I7121 Aneurysm of the ascending aorta, without rupture: Secondary | ICD-10-CM

## 2022-11-13 ENCOUNTER — Encounter: Payer: Self-pay | Admitting: Neurology

## 2022-11-13 ENCOUNTER — Telehealth: Payer: Self-pay | Admitting: Neurology

## 2022-11-13 ENCOUNTER — Institutional Professional Consult (permissible substitution): Payer: Medicare HMO | Admitting: Neurology

## 2022-11-13 NOTE — Telephone Encounter (Signed)
NS for New neuro consult. Please note, that patient is by now established with [facility] for the same issue he was referred here, and does not need to be scheduled with [place].

## 2022-12-04 ENCOUNTER — Other Ambulatory Visit (HOSPITAL_COMMUNITY): Payer: 59

## 2022-12-19 ENCOUNTER — Inpatient Hospital Stay: Admission: RE | Admit: 2022-12-19 | Payer: 59 | Source: Ambulatory Visit

## 2022-12-27 ENCOUNTER — Ambulatory Visit: Payer: 59

## 2023-01-02 ENCOUNTER — Ambulatory Visit (HOSPITAL_COMMUNITY): Payer: 59 | Attending: Thoracic Surgery (Cardiothoracic Vascular Surgery)

## 2023-01-03 ENCOUNTER — Encounter (HOSPITAL_COMMUNITY): Payer: Self-pay | Admitting: Thoracic Surgery (Cardiothoracic Vascular Surgery)

## 2023-08-22 ENCOUNTER — Emergency Department (HOSPITAL_BASED_OUTPATIENT_CLINIC_OR_DEPARTMENT_OTHER)

## 2023-08-22 ENCOUNTER — Other Ambulatory Visit: Payer: Self-pay

## 2023-08-22 ENCOUNTER — Encounter (HOSPITAL_BASED_OUTPATIENT_CLINIC_OR_DEPARTMENT_OTHER): Payer: Self-pay

## 2023-08-22 ENCOUNTER — Emergency Department (HOSPITAL_BASED_OUTPATIENT_CLINIC_OR_DEPARTMENT_OTHER)
Admission: EM | Admit: 2023-08-22 | Discharge: 2023-08-22 | Disposition: A | Discharge status: Home / Self Care | Attending: Emergency Medicine | Admitting: Emergency Medicine

## 2023-08-22 DIAGNOSIS — X58XXXA Exposure to other specified factors, initial encounter: Secondary | ICD-10-CM | POA: Diagnosis not present

## 2023-08-22 DIAGNOSIS — Z8616 Personal history of COVID-19: Secondary | ICD-10-CM | POA: Diagnosis not present

## 2023-08-22 DIAGNOSIS — E119 Type 2 diabetes mellitus without complications: Secondary | ICD-10-CM | POA: Insufficient documentation

## 2023-08-22 DIAGNOSIS — S199XXA Unspecified injury of neck, initial encounter: Secondary | ICD-10-CM | POA: Diagnosis present

## 2023-08-22 DIAGNOSIS — S161XXA Strain of muscle, fascia and tendon at neck level, initial encounter: Secondary | ICD-10-CM | POA: Diagnosis not present

## 2023-08-22 MED ORDER — LIDOCAINE 5 % EX PTCH
1.0000 | MEDICATED_PATCH | CUTANEOUS | Status: DC
  Administered 2023-08-22: 1 via TRANSDERMAL
  Filled 2023-08-22: qty 1

## 2023-08-22 MED ORDER — HYDROMORPHONE HCL 1 MG/ML IJ SOLN
1.0000 mg | Freq: Once | INTRAMUSCULAR | Status: AC
  Administered 2023-08-22: 1 mg via INTRAMUSCULAR
  Filled 2023-08-22: qty 1

## 2023-08-22 MED ORDER — METHOCARBAMOL 500 MG PO TABS: 500.0000 mg | ORAL_TABLET | Freq: Three times a day (TID) | ORAL | 0 refills | Status: DC | PRN

## 2023-08-22 MED ORDER — OXYCODONE-ACETAMINOPHEN 5-325 MG PO TABS: 1.0000 | ORAL_TABLET | Freq: Four times a day (QID) | ORAL | 0 refills | Status: AC | PRN

## 2023-08-22 MED ORDER — LIDOCAINE 5 % EX OINT: 1.0000 | TOPICAL_OINTMENT | Freq: Three times a day (TID) | CUTANEOUS | 0 refills | Status: DC | PRN

## 2023-08-22 MED ORDER — KETOROLAC TROMETHAMINE 30 MG/ML IJ SOLN
30.0000 mg | Freq: Once | INTRAMUSCULAR | Status: AC
  Administered 2023-08-22: 30 mg via INTRAMUSCULAR
  Filled 2023-08-22: qty 1

## 2023-08-22 MED ORDER — SENNOSIDES-DOCUSATE SODIUM 8.6-50 MG PO TABS: 1.0000 | ORAL_TABLET | Freq: Every evening | ORAL | 0 refills | Status: DC | PRN

## 2023-08-22 NOTE — ED Triage Notes (Signed)
 Pt reports pain in neck since yesterday that has worsened significantly in the past 2 hours. Denies other symptoms

## 2023-08-22 NOTE — ED Notes (Signed)
 Pt. Reports he has neck pain that started on yesterday and got worse as the day went on.  Pt. Here with severe neck.

## 2023-08-22 NOTE — Discharge Instructions (Signed)
 You were seen in the emergency department today with neck pain.  Please keep your appointment for MRI next week.  I have called in a short course of pain medicine.  The Percocet will cause constipation and you cannot drive a car while taking this.  It can be habit-forming and so only use as needed for severe pain.  I called in some medicine for constipation to help with this as well.  Return if you develop any sudden worsening pain or numbness/weakness worsening in your arms or legs.

## 2023-08-22 NOTE — ED Provider Notes (Signed)
 Emergency Department Provider Note   I have reviewed the triage vital signs and the nursing notes.   HISTORY  Chief Complaint Torticollis   HPI Adrian Bernard is a 68 y.o. male past history reviewed below including diabetes presents the emergency department with neck pain.  This is an acute on chronic problem.  He has had surgery in his cervical spine in the past and feels like this area has been causing him increased pain over the past couple of days.  In the past several hours has had sudden worsening pain symptoms.  Slight weakness in both hands but minimal.  No numbness.  No leg symptoms.  No fevers or chills.  No sore throat.  He has been coordinating with his spine team and has MRI scheduled for next week.  He is hoping for some pain medication to better control his symptoms until that time. Denies injury.    Past Medical History:  Diagnosis Date   Arthritis    fingers of both hands   GERD (gastroesophageal reflux disease)    High cholesterol    History of COVID-19 12/29/2020   mild symptoms all symptoms resolved   History of hypertension    no meds x 1 year since weight loss per pt on 04-21-2021   Lower back pain    Restless legs syndrome    Type 2 Diabetes mellitus without complication (HCC)    Vitamin D deficiency    Wears denture upper is implants hard to remove per pt    Wears partial denture lower     Review of Systems  Constitutional: No fever/chills Cardiovascular: Denies chest pain. Respiratory: Denies shortness of breath. Gastrointestinal: No abdominal pain.  Musculoskeletal: Positive neck pain.  Skin: Negative for rash. Neurological: Negative for headaches. No numbness. Slight weakness in both hands.    ____________________________________________   PHYSICAL EXAM:  VITAL SIGNS: ED Triage Vitals  Encounter Vitals Group     BP 08/22/23 1513 (!) 159/77     Pulse Rate 08/22/23 1513 93     Resp 08/22/23 1513 (!) 24     Temp 08/22/23 1513 97.9  F (36.6 C)     Temp Source 08/22/23 1513 Oral     SpO2 08/22/23 1513 96 %   Constitutional: Alert and oriented. Patient appears uncomfortable but able to provide a full history.  Eyes: Conjunctivae are normal.  Head: Atraumatic. Nose: No congestion/rhinnorhea. Mouth/Throat: Mucous membranes are moist.  Oropharynx non-erythematous. Neck: No stridor.  No midline c spine tenderness.  Cardiovascular: Normal rate, regular rhythm. Good peripheral circulation. Grossly normal heart sounds.   Respiratory: Normal respiratory effort.  No retractions. Lungs CTAB. Gastrointestinal: No distention.  Musculoskeletal:  No gross deformities of extremities. Neurologic:  Normal speech and language. No gross focal neurologic deficits are appreciated.  5/5 strength in the bilateral upper extremities with normal sensation throughout. Skin:  Skin is warm, dry and intact. No rash noted.  ____________________________________________  RADIOLOGY  CT Cervical Spine Wo Contrast Result Date: 08/22/2023 CLINICAL DATA:  Neck trauma.  Pain since yesterday. EXAM: CT CERVICAL SPINE WITHOUT CONTRAST TECHNIQUE: Multidetector CT imaging of the cervical spine was performed without intravenous contrast. Multiplanar CT image reconstructions were also generated. RADIATION DOSE REDUCTION: This exam was performed according to the departmental dose-optimization program which includes automated exposure control, adjustment of the mA and/or kV according to patient size and/or use of iterative reconstruction technique. COMPARISON:  CT cervical spine 09/23/2022 and 11/26/2021. FINDINGS: Alignment: Stable alignment with minimal anterolisthesis at  C3-4. Skull base and vertebrae: No evidence of acute fracture or traumatic subluxation. Postsurgical changes from previous C3-4 and C4-5 ACDF. No evidence of hardware displacement. Soft tissues and spinal canal: No prevertebral fluid or swelling. No visible canal hematoma. Disc levels: Similar  multilevel spondylosis with uncinate spurring and facet hypertrophy. Grossly stable small central disc protrusion at C2-3. Grossly stable mild-to-moderate foraminal narrowing at multiple levels, greatest on the right at C6-7. Upper chest: Grossly stable scarring at both lung apices. Other: None. IMPRESSION: 1. No evidence of acute cervical spine fracture, traumatic subluxation or static signs of instability. 2. Stable postsurgical changes from previous C3-4 and C4-5 ACDF. 3. Stable multilevel spondylosis. Electronically Signed   By: Elsie Perone M.D.   On: 08/22/2023 16:42    ____________________________________________   PROCEDURES  Procedure(s) performed:   Procedures  None ____________________________________________   INITIAL IMPRESSION / ASSESSMENT AND PLAN / ED COURSE  Pertinent labs & imaging results that were available during my care of the patient were reviewed by me and considered in my medical decision making (see chart for details).   This patient is Presenting for Evaluation of neck pain, which does require a range of treatment options, and is a complaint that involves a high risk of morbidity and mortality.  The Differential Diagnoses include cervical radiculopathy, cord compression, epidural abscess, MSK strain, etc.  Critical Interventions-    Medications  lidocaine  (LIDODERM ) 5 % 1 patch (1 patch Transdermal Patch Applied 08/22/23 1602)  ketorolac  (TORADOL ) 30 MG/ML injection 30 mg (30 mg Intramuscular Given 08/22/23 1603)  HYDROmorphone  (DILAUDID ) injection 1 mg (1 mg Intramuscular Given 08/22/23 1608)    Reassessment after intervention:  pain improved.    I did obtain Additional Historical Information from family at bedside.   I decided to review pertinent External Data, and in summary patient with similar visit in 08/24.   Radiologic Tests Ordered, included CT c spine. I independently interpreted the images and agree with radiology interpretation.   Social  Determinants of Health Risk patient is a smoker.   Medical Decision Making: Summary:  The patient presents to the emergency department with acute on chronic neck pain.  No neurodeficits.  He does appear uncomfortable.  He has an MRI scheduled for next week.  I do not appreciate any red flag signs or symptoms to prompt emergent MRI.  I will obtain CT cervical spine imaging here in the ED to screen for acute bony abnormality or traumatic process.  No subjective sore throat.  Posterior pharynx appears normal.  Suspect cervical radiculopathy clinically.  Reevaluation with update and discussion with patient.  Symptoms improved after pain management in the ED.  CT without acute finding.  Has MRI scheduled along with close spine follow-up.  After PDMP review will prescribe a short course of pain medication.   Patient's presentation is most consistent with acute presentation with potential threat to life or bodily function.   Disposition: discharge  ____________________________________________  FINAL CLINICAL IMPRESSION(S) / ED DIAGNOSES  Final diagnoses:  Strain of neck muscle, initial encounter     NEW OUTPATIENT MEDICATIONS STARTED DURING THIS VISIT:  New Prescriptions   LIDOCAINE  (XYLOCAINE ) 5 % OINTMENT    Apply 1 Application topically 3 (three) times daily as needed for moderate pain (pain score 4-6).   METHOCARBAMOL  (ROBAXIN ) 500 MG TABLET    Take 1 tablet (500 mg total) by mouth every 8 (eight) hours as needed.   OXYCODONE -ACETAMINOPHEN  (PERCOCET/ROXICET) 5-325 MG TABLET    Take 1 tablet by  mouth every 6 (six) hours as needed for up to 5 days for severe pain (pain score 7-10).   SENNA-DOCUSATE (SENOKOT-S) 8.6-50 MG TABLET    Take 1 tablet by mouth at bedtime as needed for moderate constipation or mild constipation.    Note:  This document was prepared using Dragon voice recognition software and may include unintentional dictation errors.  Fonda Law, MD, [facility] Emergency Medicine     Raigan Baria, Fonda MATSU, MD 08/22/23 1700

## 2023-12-12 ENCOUNTER — Encounter: Payer: Self-pay | Admitting: Internal Medicine

## 2023-12-21 NOTE — Progress Notes (Unsigned)
 Referring Physician:  [facility] [address] [place],  [place] [address]  Primary Physician:  Adrian Luke POUR, MD  History of Present Illness: 12/21/2023 [name] is here today with a chief complaint of ***  Discuss SCS SCS trial was done on 11/08/2023,  Psych Evaluation report in Media on 09/10/2023    Bowel/Bladder Dysfunction: none  Conservative measures:  Physical therapy: *** has not participated in? Multimodal medical therapy including regular antiinflammatories: *** Gabapentin, Ibuprofen , Lidocaine  patch, Oxycodone  Injections:10/26/2022 C4/C5, C5/C6 Medial Branch Block 05/25/2022  C7-T1 CESI 11/10/2021 Bilateral C4-C6 facet joint injection  Past Surgery: ***06/03/2020 C3-5 Cervical Fusion  The symptoms are causing a significant impact on the patient's life.   I have utilized the care everywhere function in epic to review the outside records available from external health systems.  Review of Systems:  A 10 point review of systems is negative, except for the pertinent positives and negatives detailed in the HPI.  Past Medical History: Past Medical History:  Diagnosis Date   Arthritis    fingers of both hands   GERD (gastroesophageal reflux disease)    High cholesterol    History of COVID-19 12/29/2020   mild symptoms all symptoms resolved   History of hypertension    no meds x 1 year since weight loss per pt on 04-21-2021   Lower back pain    Restless legs syndrome    Type 2 Diabetes mellitus without complication (HCC)    Vitamin D deficiency    Wears denture upper is implants hard to remove per pt    Wears partial denture lower     Past Surgical History: Past Surgical History:  Procedure Laterality Date   cervical spinal anterior discectomy with fusion C 3 to C 5  06/03/2020   done @ [facility]   dental implants     2020    Allergies: Allergies as of 12/31/2023   (No Known Allergies)     Medications:  Current Outpatient Medications:    Ascorbic Acid (VITAMIN C) 500 MG CAPS, Take by mouth daily at 6 (six) AM., Disp: , Rfl:    aspirin  81 MG EC tablet, Take 81 mg by mouth daily. Pt instructed to stop 81 asa 2 days before 04-25-2021 surgery by dr zan, Disp: , Rfl:    benzonatate  (TESSALON ) 100 MG capsule, Take 1 capsule (100 mg total) by mouth every 8 (eight) hours., Disp: 21 capsule, Rfl: 0   buPROPion (WELLBUTRIN XL) 150 MG 24 hr tablet, Take 150 mg by mouth daily., Disp: , Rfl:    cephALEXin  (KEFLEX ) 500 MG capsule, Take 1 capsule (500 mg total) by mouth 4 (four) times daily., Disp: 20 capsule, Rfl: 0   Cyanocobalamin (VITAMIN B 12 PO), Take by mouth daily., Disp: , Rfl:    doxycycline  (VIBRAMYCIN ) 100 MG capsule, Take 1 capsule (100 mg total) by mouth 2 (two) times daily. One po bid x 7 days, Disp: 14 capsule, Rfl: 0   empagliflozin (JARDIANCE) 25 MG TABS tablet, Take by mouth daily., Disp: , Rfl:    gabapentin (NEURONTIN) 300 MG capsule, Take 300 mg by mouth at bedtime., Disp: , Rfl:    ibuprofen  (ADVIL ) 800 MG tablet, Take 800 mg by mouth every 8 (eight) hours as needed., Disp: , Rfl:    JANUVIA 100 MG tablet, Take 100 mg by mouth daily. , Disp: , Rfl: 3   lidocaine  (XYLOCAINE ) 5 % ointment, Apply 1 Application topically  3 (three) times daily as needed for moderate pain (pain score 4-6)., Disp: 35.44 g, Rfl: 0   metFORMIN (GLUCOPHAGE) 1000 MG tablet, Take 1,000 mg by mouth 2 (two) times daily. Take 1 tab in the morning and 1 tab in the evening , Disp: , Rfl:    methocarbamol  (ROBAXIN ) 500 MG tablet, Take 1 tablet (500 mg total) by mouth every 8 (eight) hours as needed., Disp: 20 tablet, Rfl: 0   Multiple Vitamins-Minerals (MULTIVITAMIN WITH MINERALS) tablet, Take 1 tablet by mouth daily., Disp: , Rfl:    Omega-3 Fatty Acids (OMEGA 3 500 PO), Take by mouth., Disp: , Rfl:    omeprazole (PRILOSEC) 20 MG capsule, Take 20 mg by mouth at bedtime., Disp: , Rfl:     rosuvastatin (CRESTOR) 20 MG tablet, Take 20 mg by mouth daily., Disp: , Rfl:    Semaglutide, 1 MG/DOSE, (OZEMPIC, 1 MG/DOSE,) 4 MG/3ML SOPN, Inject 4 mg into the skin once a week. friday, Disp: , Rfl:    senna-docusate (SENOKOT-S) 8.6-50 MG tablet, Take 1 tablet by mouth at bedtime as needed for moderate constipation or mild constipation., Disp: 20 tablet, Rfl: 0   tadalafil (CIALIS) 10 MG tablet, Take 10 mg by mouth daily as needed for erectile dysfunction., Disp: , Rfl:    tiZANidine  (ZANAFLEX ) 4 MG tablet, Take 1 tablet (4 mg total) by mouth 3 (three) times daily., Disp: 30 tablet, Rfl: 0   VITAMIN D PO, Take by mouth daily., Disp: , Rfl:   Social History: Social History   Tobacco Use   Smoking status: Every Day    Current packs/day: 1.00    Average packs/day: 1 pack/day for 40.0 years (40.0 ttl pk-yrs)    Types: Cigarettes   Smokeless tobacco: Never  Vaping Use   Vaping status: Never Used  Substance Use Topics   Alcohol use: Never   Drug use: Never    Family Medical History: Family History  Problem Relation Age of Onset   Heart disease Father    Diabetes Brother    Heart disease Brother    Diabetes Brother    Heart disease Brother    Colon cancer Neg Hx     Physical Examination: There were no vitals filed for this visit.  General: Patient is in no apparent distress. Attention to examination is appropriate.  Neck:   Supple.  Full range of motion.  Respiratory: Patient is breathing without any difficulty.   NEUROLOGICAL:     Awake, alert, oriented to person, place, and time.  Speech is clear and fluent.   Cranial Nerves: Pupils equal round and reactive to light.  Facial tone is symmetric.  Facial sensation is symmetric. Shoulder shrug is symmetric. Tongue protrusion is midline.    Strength: Side Biceps Triceps Deltoid Interossei Grip Wrist Ext. Wrist Flex.  R 5 5 5 5 5 5 5   L 5 5 5 5 5 5 5    Side Iliopsoas Quads Hamstring PF DF EHL  R 5 5 5 5 5 5   L 5 5 5 5  5 5    Reflexes are ***2+ and symmetric at the biceps, triceps, brachioradialis, patella and achilles.   Hoffman's is absent. Clonus is absent  Bilateral upper and lower extremity sensation is intact to light touch ***.     No evidence of dysmetria noted.  Gait is normal.    Imaging: *** I have personally reviewed the images and agree with the above interpretation.  Medical Decision Making/Assessment and Plan: Mr. Breton is  a pleasant 68 y.o. male with ***  There are no diagnoses linked to this encounter.   Thank you for involving me in the care of this patient.    Penne MICAEL Sharps MD/MSCR Neurosurgery

## 2023-12-24 ENCOUNTER — Inpatient Hospital Stay
Admission: RE | Admit: 2023-12-24 | Discharge: 2023-12-24 | Disposition: A | Payer: Self-pay | Source: Ambulatory Visit | Attending: Neurosurgery | Admitting: Neurosurgery

## 2023-12-24 ENCOUNTER — Other Ambulatory Visit: Payer: Self-pay | Admitting: Family Medicine

## 2023-12-24 DIAGNOSIS — Z049 Encounter for examination and observation for unspecified reason: Secondary | ICD-10-CM

## 2023-12-31 ENCOUNTER — Encounter: Payer: Self-pay | Admitting: Neurosurgery

## 2023-12-31 ENCOUNTER — Other Ambulatory Visit: Payer: Self-pay

## 2023-12-31 ENCOUNTER — Ambulatory Visit: Payer: Self-pay | Admitting: Neurosurgery

## 2023-12-31 ENCOUNTER — Ambulatory Visit: Admitting: Neurosurgery

## 2023-12-31 VITALS — BP 134/78 | Ht 68.0 in | Wt 170.1 lb

## 2023-12-31 DIAGNOSIS — Z01818 Encounter for other preprocedural examination: Secondary | ICD-10-CM

## 2023-12-31 DIAGNOSIS — M961 Postlaminectomy syndrome, not elsewhere classified: Secondary | ICD-10-CM

## 2023-12-31 DIAGNOSIS — G894 Chronic pain syndrome: Secondary | ICD-10-CM

## 2023-12-31 DIAGNOSIS — G8929 Other chronic pain: Secondary | ICD-10-CM | POA: Diagnosis not present

## 2023-12-31 DIAGNOSIS — M5412 Radiculopathy, cervical region: Secondary | ICD-10-CM

## 2023-12-31 NOTE — Patient Instructions (Addendum)
 Please see below for information in regards to your upcoming surgery:   Planned surgery: Insertion, spinal cord stimulator, cervical [facility])   Surgery date: 02/12/2024 at [facility] (Medical Mall: [address], [place], [place] [address]) - you will find out your arrival time the business day before your surgery. *Addendum: Date changed on 02/05/24   Pre-op appointment at [facility] Pre-admit Testing: you will receive a call with a date/time for this appointment. If you are scheduled for an in person appointment, Pre-admit Testing is located on the first floor of the [facility] building, [address] [facility] [address] [facility], [address]. During this appointment, they will advise you which medications you can take the morning of surgery, and which medications you will need to hold for surgery. Labs (such as blood work, EKG) may be done at your pre-op appointment. You are not required to fast for these labs. Should you need to change your pre-op appointment, please call Pre-admit testing at [phone].     Blood thinners:   Aspirin  81mg :   stop aspirin  7 days prior, resume aspirin  14 days after     Diabetes/heart failure/kidney disease/weight loss medications that require an extended hold: Per anesthesia guidelines (due to the increased risk of aspiration caused by delayed gastric emptying):  Ozempic injections: hold for 7 days prior to surgery Metformin: hold for 2 days prior to surgery     Surgical clearance: we will send a clearance form to Dr Luke Manns. They may wish to see you in their office prior to signing the clearance form. If so, they may call you to schedule an appointment.    Common restrictions after spine surgery: No bending, lifting, or twisting (BLT). Avoid lifting objects heavier than 10 pounds for the first 6 weeks after surgery. Where possible, avoid household activities that involve lifting, bending, reaching, pushing, or pulling  such as laundry, vacuuming, grocery shopping, and childcare. Try to arrange for help from friends and family for these activities while you heal. Do not drive while taking prescription pain medication. Weeks 6 through 12 after surgery: avoid lifting more than 25 pounds.     How to contact us :  If you have any questions/concerns before or after surgery, you can reach us  at [phone], or you can send a mychart message. We can be reached by phone or mychart 8am-4pm, Monday-Friday.  *Please note: Calls after 4pm are forwarded to a third party answering service. Mychart messages are not routinely monitored during evenings, weekends, and holidays. Please call our office to contact the answering service for urgent concerns during non-business hours.    If you have FMLA/disability paperwork, please drop it off or fax it to [phone]   Appointments/FMLA & disability paperwork: Reche Hait, & Nichole Registered Nurse/Surgery scheduler: Kendelyn, RN & Katie, RN Certified Medical Assistants: Don, CMA, Elenor, CMA, Damien, CMA, & Auston, [place] Physician Assistants: Lyle Decamp, PA-C, Edsel Goods, PA-C & Glade Boys, PA-C Surgeons: Penne Sharps, MD & Reeves Daisy, MD   [facility] PREADMIT TESTING VISIT and SURGERY INFORMATION SHEET   Now that surgery has been scheduled you can anticipate several phone calls from [facility] services. A pharmacy technician will call you to verify your current list of medications taken at home.               The Pre-Service Center will call to verify your insurance information and to give you billing estimates and information.  The Preadmit Testing Office will be calling to schedule a visit to obtain information for the anesthesia team and provide instructions on preparation for surgery.  What can you expect for the Preadmit Testing Visit: Appointments may be scheduled in-person or by telephone.  If a telephone  visit is scheduled, you may be asked to come into the office to have lab tests or other studies performed.   This visit will not be completed any greater than 14 days prior to your surgery.  If your surgery has been scheduled for a future date, please do not be alarmed if we have not contacted you to schedule an appointment more than a month prior to the surgery date.    Please be prepared to provide the following information during this appointment:            -Personal medical history                                               -Medication and allergy list            -Any history of problems with anesthesia              -Recent lab work or diagnostic studies            -Please notify us  of any needs we should be aware of to provide the best care possible           -You will be provided with instructions on how to prepare for your surgery.    On The Day of Surgery:  You must have a driver to take you home after surgery, you will be asked not to drive for 24 hours following surgery.  Taxi, Gisele and non-medical transport will not be acceptable means of transportation unless you have a responsible individual who will be traveling with you.  Visitors in the surgical area:   2 people will be able to visit you in your room once your preparation for surgery has been completed. During surgery, your visitors will be asked to wait in the Surgery Waiting Area.  It is not a requirement for them to stay, if they prefer to leave and come back.  Your visitor(s) will be given an update once the surgery has been completed.  No visitors are allowed in the initial recovery room to respect patient privacy and safety.  Once you are more awake and transfer to the secondary recovery area, or are transferred to an inpatient room, visitors will again be able to see you.  To respect and protect your privacy: We will ask on the day of surgery who your driver will be and what the contact number for that individual  will be. We will ask if it is okay to share information with this individual, or if there is an alternative individual that we, or the surgeon, should contact to provide updates and information. If family or friends come to the surgical information desk requesting information about you, who you have not listed with us , no information will be given.   It may be helpful to designate someone as the main contact who will be responsible for updating your other friends and family.    PREADMIT TESTING OFFICE: [phone] SAME DAY SURGERY: [phone] We look forward to caring for you before and throughout the process of your surgery.

## 2024-01-01 ENCOUNTER — Encounter: Payer: Self-pay | Admitting: Gastroenterology

## 2024-01-06 NOTE — Progress Notes (Signed)
 REQUESTING PHYSICIAN:Kim MARLA Manns, MD  PRIMARY CARE PHYSICIAN Luke MARLA Manns, MD   HISTORY OF PRESENT ILLNESS: Adrian Bernard is a 68 y.o. male with past medical history of  HLP, Moderate coronary calcifications, Aortic atherosclerosis, Thoracic aortic aneurism, ascending aorta 4.4cm from Aug, 2025, 4.3cm from Sept, 2022, Overweight, BMI 27.2, DM type 2, uncontrolled, HgbA1c 10.4% from April, 2025, 6.9% from Oct, 2025, COPD, emphysema, Vit D deficiency, Multiple thyroid nodules, H/o colon polyps, H/o elevated LFTs, Peripheral polyneuropathy, Cervical spondylosis with myelopathy, Cervical radiculopathy, Lumbar radiculopathy, Bilateral metatarsalgia of feet, Restless leg syndrome, OSA,  Tobacco abuse, presented for evaluation for preop evaluation prior to placement of spinal stimulation with known risk factors with moderate coronary artery calcifications and thoracic aortic aneurysm.  Patient has known history of stable small ascending aortic aneurysm measuring 4.4 cm. Patient has history of moderate coronary artery calcifications and aortic atherosclerosis.  Patient is going to have placement of spinal stimulator on February 05, 2024 and patient was referred for cardiologist evaluation.  Bilateral feet pain secondary to metatarsalgia of feet is bothering the patient.  Patient denies chest pain, shortness of breath, palpitations, near syncope or syncope, orthopnoe or PND.  Patient can walk 1/2 mile without problems if bilateral feet pain not bothering him.  Because of bilateral feet pain due to metatarsalgia patient can walk only 100 to 200 yards.  Patient doesn't exercises regularly. Patient follows with foot doctor.  Patient cannot say if he snores or not. Patient has obstructive sleep apnea diagnosed several years ago. Patient tried CPAP at night but could not tolerate it and does not use it.  Patient has gained 30-40 pounds over the last year and weight is now 169  pounds with BMI 26.5. Prior the patient lost 30-40 pounds over the year. Per patient over the last few years his weight is stable around 170 pounds.  Patient doesn't drink enough water daily and drinks only 32 ounces. Patient drinks only 1 cup of coffee daily. Patient drinks Gatorade Zero 12 ounces every other day. Patient doesn't drink Energy drinks.  BP and HR are under control today 116/62 with HR 81-89. Per patient BP remains normal at home.  No history of hypertension.  ECG today on January 10, 2024- Normal sinus rhythm , HR 81 Normal ECG When compared with ECG of 25-May-2020 09:27, or August 02, 2022 No significant change was found   I have personally reviewed ECG from  August 02, 2022. My interpretation- Sinus Rhythm, HR 66 Early repolarization changes.  WITHIN NORMAL LIMITS  I have personally reviewed ECG from  May 25, 2020. My  NSR, HR 65 Normal ECG  Chest x-ray from Aug, 2024 - 1. No acute cardiopulmonary disease.  2. Emphysema.   CT chest from Sep 28, 2023 - No suspicious pulmonary nodules.  Mild diffuse bronchial thickening.  Moderate scarring in the lung apices.  Previously noted tree-in-bud opacities in the left upper lobe and lingula have resolved.  Moderate coronary calcifications. No adenopathy.  Dilated ascending aorta 4.4 cm compare to previous measured 4.3 cm in September 2022.  Main pulmonary artery normal in size.  Heart size normal. No pericardial effusion.   Chest CTA from Nov 19, 2021 - 1. Negative for acute PE or thoracic aortic dissection.  2. Clustered nodular airspace opacities in the right middle lobe and lingula, likely infectious/inflammatory.  3. 4 cm ascending thoracic aortic aneurysm. Recommend annual imaging followup by CTA or MRA. This recommendation follows 2010 ACCF/AHA/AATS/ACR/ASA/SCA/SCAI/SIR/STS/SVM Guidelines for the Diagnosis  and Management of Patients with Thoracic Aortic Disease. Circulation. 2010; 121: [id number]  4. Coronary and  Aortic Atherosclerosis.  Chest CT w/o contrast from Sept, 2022 - pulmonary nodules. Mild coronary calcifications. No adenopathy.  Fusiform ectasia of the ascending aorta measures 4.3 cm.   Right LE US  from July, 2022 - negative for DVT.  Head CT from Oct, 2023 - 1. No acute intracranial abnormalities.  2. Mild sinus inflammation.  3. No evidence for cervical spine fracture or subluxation.  4. Postop change from anterior cervical discectomy and interbody fusion at C3-4 and C4-5.   Recent blood tests from this month, Jan 01, 2024 - Na 137, K 4.6, creat 0.83, LFTs normal, GFR 97. Glucose 160. WBC 8.4, Hgb 13.3, MCV 89, plats 335.  From Oct, 2025 - HgbA1c 6.9%.  From April, 2025 - LDL 76, HDL 36, TG 203, TC 146.  HgbA1c 10.4%. TSH 1.97. glucose 245.  From Aug, 2024 - INR 1.0.  Patient smokes 1 pack of cigarettes a day. No history of ETOH or drug abuse.  No Family history of early CAD or family history of sudden cardiac death.   Patient's brother had MI at age 65. Another brother had MI at age 71.    MEDICAL HISTORY: Past Medical History[1]  SURGICAL HISTORY: Past Surgical History[2]  ALLERGIES: Allergies[3]  MEDICATIONS: Current Medications[4]   FAMILY HISTORY: Family History[5]  SOCIAL HISTORY: Social History[6]  REVIEW OF SYSTEMS:The patient denies edema, cough, near syncope or syncope, nausea, vomiting, diarrhea, fever, chills, or bleeding. All other systems are reviewed and are negative except for that mentioned in the history of present illness.   PHYSICAL EXAM:  Vital Signs: BP 116/62 (BP Location: Left Upper Arm, Patient Position: Sitting)   Pulse 89   Ht 5' 7 (1.702 m)   Wt 169 lb 4.8 oz (76.8 kg)   SpO2 98%   BMI 26.52 kg/m  Constitutional:  Well nourished, well developed patient in no apparent distress. HENT:normocephalic,atraumatic, oropharynx moist,no oral exudates,nose normal. Neck: Supple. No JVD. Carotid bruits: Absent. Eyes: Conjunctiva  normal, no discharge. Lymphatic:no lymphadenopathy noted. Cardiovascular: RRR. S1, S2. 1/6 systolic murmur at the left sternal border. No gallop. No rub. Respiratory: Mildly decreased breath sounds to auscultation bilaterally.  No wheezing, no rales. GI: Soft, nontender, nondistended, normal bowel sounds, no masses or hepatosplenomegaly.   Skin: Warm, dry, no erythema, no rash. Musculoskeletal: No edema, no tenderness, no cyanosis, no clubbing. Pulses: 1+ and intact bilaterally. Neurological:  Neuro exam is grossly normal with no focal deficits noted. Psychiatric: Patient is alert and oriented to person, place and time, normal affect.  No results found.  Recent Results (from the past 24 hours)  ECG 12 lead   Collection Time: 01/10/24  9:11 AM  Result Value Ref Range   Acquisition Device [id number]    Ventricular Rate 81 BPM   Atrial Rate 81 BPM   P-R Interval 174 ms   QRS Duration 82 ms   Q-T Interval 342 ms   QTC Calculation(Bazett) 397 ms   Calculated P Axis 72 degrees   Calculated R Axis 44 degrees   Calculated T Axis 90 degrees   ECG Diagnosis      Normal sinus rhythm , HR 81 Normal ECG When compared with ECG of 25-May-2020 09:27, No significant change was found Fredrica Lipoma ([id number]) on 01/10/2024 9:17:22 AM certifies that he/she has reviewed the ECG tracing and confirms the independent interpretation is correct.    Lab Results  Component Value Date  Cholesterol, Total 146 05/09/2023   Lab Results  Component Value Date   HDL 36 (L) 05/09/2023   No components found for: [facility] Lab Results  Component Value Date   Triglycerides 203 (H) 05/09/2023   No results found for: [facility] Lab Results  Component Value Date   Creatinine 0.83 01/01/2024   BUN 20 01/01/2024   Sodium 137 01/01/2024   Potassium 4.6 01/01/2024   Chloride 100 01/01/2024   CO2 21 01/01/2024   No results found for: [facility] Lab Results  Component Value Date   Hemoglobin 13.3  01/01/2024   Lab Results  Component Value Date   INR 0.9 04/04/2021      ASSESSMENT: Problem List[7]   Adrian Bernard is a 68 y.o. male with past medical history as above presented for evaluation for preop evaluation prior to placement of spinal stimulation with known risk factors with moderate coronary artery calcifications and thoracic aortic aneurysm.  Preop evaluation prior to placement of spinal stimulator planned for February 05, 2024 - patient denies chest pain, shortness of breath, palpitations.   He has very limited mobility due to bilateral metatarsalgia feet and he can walk only 100 to 200 yards.   Patient has risk factors for CAD-IDDM type II, smoking, age, hyperlipidemia. Patient has known history of moderate coronary artery calcifications. I will get cardiac echo to reevaluate heart function and structure.  I will get coronary CTA to evaluate for CAD. If above workup is unremarkable patient could proceed with spinal stimulator placement on February 05, 2024-considering comorbidities patient has some risk of perioperative ischemic events but if above workup is unremarkable the risk would not be high.   I would avoid fluctuation of the blood pressure or heart rate during surgery.   I would keep potassium between 4.0 and 5.0 and magnesium  between 2.0 and 2.5.    Moderate coronary artery calcifications-patient denies chest pain, dyspnea, palpitations, near-syncope or syncope. BP and HR are under control. Bilateral feet metatarsalgia is limiting his mobility significantly.  If noted bilateral feet pain as per patient he could walk half a mile. I will get cardiac echo to reevaluate heart function and structure.  I will get coronary CTA to evaluate for CAD. Patient takes aspirin  81 mg and rosuvastatin 20 mg.  Small thoracic aortic aneurysm -ascending aorta apparently measured 4.3 cm by previous CT from September, 2022. From October 2023 chest CT revealed ascending aortic  aneurysm 4.0 cm. From August 2025 stable thoracic aortic aneurysm measuring 4.4 cm noted by CT as above. BP is under control.  Heart rate is under control today but patient does not measure her heart rate at home.  I strongly encouraged patient to start recording BP and HR and let me know.  Hyperlipidemia-patient is on rosuvastatin 20mg  per PCPs suggestion.  The goal of LDL cholesterol less than 899, HDL cholesterol more than 45, Triglycerides less than 150.  Recent lipid panel from April 2025 was mildly abnormal with LDL at goal 76 but HDL slightly low 36 and TG slightly elevated 203 as above.  See overweight below. Further management of HLP as per PCP.  Smoking - Patient smokes 1 pack of cigarettes a day. I strongly encouraged patient to quit smoking. I suggested to consider nicotine patches.  Patient is going to consider nicotine patches if he can afford them.  Smoking cessation counseling-3 minutes.  OSA - Patient cannot say if he snores or not. Patient has obstructive sleep apnea diagnosed several years ago. Patient tried  CPAP at night but could not tolerate it and does not use it. Patient does not follow with sleep physician. I strongly encouraged patient to discuss with sleep physician further management of sleep apnea and if he cannot tolerate CPAP consider mouthguard device for inspire device.  Patient is interested.  Will give referral to sleep clinic. See overweight below.  Overweight -Patient has gained 30-40 pounds over the last year and weight is now 169 pounds with BMI 26.5. Prior the patient lost 30-40 pounds over the year.  I strongly encouraged patient to stay on low salt, Mediterranean diet, walk daily some and lose weight.  Family history of CAD but not early CAD as above- noted.   PLAN:  Follow up lipid panel and liver function tests as per your PCP in 2-3 months.  Please, Quit smoking as soon as you can. Consider nicotine patches.  Continue other current  medications.  Cardiac echo.  Coronary CTA.  Sleep physician evaluation.  Keep checking blood pressure and heart rate daily and record it and let us  or primary care physician know if any abnormal readings noted. Please check blood pressure and heart rate after sitting and relaxing for 5 minutes or more. The goal of systolic blood pressure 100-130, diastolic BP 60-85, HR 60-80.  Please stay on low salt, Mediterranean diet, walk daily and avoid weight gain. Please see the list of Mediterranean diet below.   Drink plenty of water - about 60-64 ounces or more a day - to avoid dehydration and volume depletion.  Please avoid caffeine.  Avoid any strenuous activities.  Please avoid alcohol.  If you develop significant chest pain, shortness of breath, palpitations, near-syncope or syncope or lightheadedness, please call 911 and be transferred to emergency room for evaluation.  Follow up in 3 months or earlier if needed.  Care plan and follow-up as discussed or as needed if any worsening symptoms or change in condition. Pt expressed understanding. No barriers to meeting goals. After visit summary was given to the patient.   Youlanda Juba, MD, PhD  Documentation for time-based billing:  Total time spent of date of service was 62 minutes.  Patient care activities included 62 minutes.  Patient care activities included preparing to see the patient such as reviewing the patient records, performing a medically appropriate history and physical examination, counseling and educating the patient and documenting clinical information in the electronic record.   Note: This documented was generated using voice recognition software. There may be unintended transcription errors that were not detected upon document review.         [1] Past Medical History: Diagnosis Date   Arthritis    Diabetes mellitus (*)    Hyperlipidemia    Hypertension    Neuropathy    Obstructive sleep apnea  on CPAP    waiting for new mask   Restless leg syndrome   [2] Past Surgical History: Procedure Laterality Date   Colonoscopy     Mouth surgery     dental implants   Spinal fusion     surgery 2022  [3] No Known Allergies [4] Current Outpatient Medications  Medication Sig Dispense Refill   ACCU-CHEK FASTCLIX LANCETS MISC Use to monitor blood glucose 2 time(s) daily 100 each 5   Alpha-Lipoic Acid 600 MG TABS Take 1 tablet by mouth daily.     Ascorbic Acid (VITAMIN C) 1000 MG tablet Take one tablet (1,000 mg dose) by mouth daily.     aspirin  81 MG EC tablet Take  one tablet (81 mg dose) by mouth daily.     azithromycin (ZITHROMAX) 250 mg tablet 2 tablets on day 1 and 1 tablet on days 2-5 6 tablet 0   Blood Glucose Monitoring Suppl (ACCU-CHEK GUIDE) w/Device KIT 1 each by Does not apply route daily. Use as directed by physician to monitor blood sugars 1 kit 0   Continuous Glucose Sensor (FREESTYLE LIBRE 3 PLUS SENSOR) MISC 1 each by Does not apply route see administration instructions. Replace every 15 days. 6 each 3   gabapentin (NEURONTIN) 100 mg capsule TAKE 1 CAPSULE(100 MG) BY MOUTH THREE TIMES DAILY 90 capsule 1   glucose blood (ACCU-CHEK GUIDE) test strip one strip (1 each dose) by Other route 3 (three) times a day. 100 each 5   HYDROcodone -acetaminophen  (NORCO) 5-325 mg per tablet Take one tablet by mouth every 6 (six) hours as needed.     ibuprofen  (ADVIL ,MOTRIN ) 800 mg tablet Take one tablet (800 mg dose) by mouth 2 (two) times a day as needed for Pain for up to 30 days. 60 tablet 1   Insulin Glargine (TOUJEO SOLOSTAR) 300 UNIT/ML SOPN Inject sixteen Units into the skin daily. 9 mL 3   Insulin Pen Needle (BD,SURE COMFORT,NOVOFINE) 32G X 4 MM MISC one each by Does not apply route daily. 100 each 3   lidocaine  (LIDODERM ) 5% Place one patch onto the skin daily.     metformin (GLUCOPHAGE) 1000 MG tablet Take one tablet (1,000 mg dose) by mouth 2 (two) times a day with  meals. 180 tablet 3   Misc. Devices MISC Start CPAP at 7 cm. water pressure.  Prefer Resmed S11 CPAP machine with a mask and supplies for severe OSA with AHI 33.  Res Med N30 Nasal mask size Medium or patient preference.  Send to a local DME. 1 each 0   Multiple Vitamins-Minerals (MENS 50+ MULTI VITAMIN/MIN PO) Take by mouth.     nortriptyline HCl (PAMELOR) 10 mg capsule TAKE 3 CAPSULES(30 MG) BY MOUTH AT BEDTIME 90 capsule 0   nortriptyline HCl (PAMELOR) 10 mg capsule Take three capsules (30 mg dose) by mouth at bedtime. 90 capsule 0   omeprazole (PRILOSEC) 20 mg capsule Take one capsule (20 mg dose) by mouth daily. For 2 weeks for acid reflux 30 capsule 0   rosuvastatin calcium (CRESTOR) 20 mg tablet TAKE 1 TABLET(20 MG) BY MOUTH DAILY 90 tablet 3   Semaglutide, 2 MG/DOSE, (OZEMPIC, 2 MG/DOSE,) 8 MG/3ML SOPN pen INJECT 2 MG INTO THE SKIN ONCE EVERY WEEK 9 mL 3   sildenafil citrate (VIAGRA) 100 mg tablet Take one tablet (100 mg dose) by mouth as needed for Erectile Dysfunction (take on empty stomach before the evening meal). 30 tablet 5   tadalafil (CIALIS) 20 MG tablet Take one tablet (20 mg dose) by mouth daily as needed for Erectile Dysfunction. 90 tablet 1   vitamin B-12 (CYANOCOBALAMIN) 1000 mcg tablet Take one tablet (1,000 mcg dose) by mouth daily.     No current facility-administered medications for this visit.  [5] Family History Problem Relation Name Age of Onset   Cancer Sister     Diabetes Brother jamal    Heart attack Brother      Father     Heart disease Brother jamal    Hypertension Brother jamal    Kidney disease Brother jamal    No Known Problems Mother     Stroke Neg Hx    [6] Social History Socioeconomic History   Marital  status: Married  Tobacco Use   Smoking status: Every Day    Current packs/day: 1.00    Average packs/day: 1 pack/day for 40.0 years (40.0 ttl pk-yrs)    Types: Cigarettes    Passive exposure: Past   Smokeless tobacco:  Former   Tobacco comments:    1 or 2  a day  Vaping Use   Vaping status: Never Used  Substance and Sexual Activity   Alcohol use: Never   Drug use: Never   Sexual activity: Yes    Partners: Female    Birth control/protection: None  [7] Patient Active Problem List Diagnosis   Hyperlipidemia   Restless legs   Pain   Erectile dysfunction   Thyroid nodule   OSA (obstructive sleep apnea)   Cervical spondylosis with myelopathy   Hyperlipidemia associated with type 2 diabetes mellitus (*)   Multiple thyroid nodules   Spondylosis without myelopathy or radiculopathy, lumbar region   Tobacco abuse   Cervical radiculopathy   Arthralgia   Peripheral polyneuropathy   Metatarsalgia of both feet   Sesamoiditis of left foot   Nocturia   History of fall   Thoracic aortic aneurysm   Vitamin D deficiency   Paresthesia of foot, bilateral   Type 2 diabetes mellitus with diabetic neuropathy, without long-term current use of insulin (*)   Lumbar radiculopathy   History of colon polyps

## 2024-01-08 ENCOUNTER — Telehealth: Payer: Self-pay

## 2024-01-08 NOTE — Telephone Encounter (Signed)
 Dr. Charlanne,   This patient is scheduled to have a colonoscopy with you on 1/20 but I do see he is scheduled to have spinal cord stimulator insertion on 1/6. Is he ok to proceed or should be rescheduled for 6 weeks out?

## 2024-01-10 NOTE — Telephone Encounter (Signed)
 Lets schedule 6 weeks out RG

## 2024-01-15 NOTE — Telephone Encounter (Signed)
 Contacted pt to r/s. Pt needs a Friday apt after 2/18 (6 weeks post op). Schedule is not out this far yet. Pt advised to call back to be r/s in about 2 weeks or after the new year. PV and colonoscopy cancelled for now.

## 2024-01-21 ENCOUNTER — Inpatient Hospital Stay: Admission: RE | Admit: 2024-01-21 | Source: Ambulatory Visit

## 2024-01-22 ENCOUNTER — Encounter
Admission: RE | Admit: 2024-01-22 | Discharge: 2024-01-22 | Disposition: A | Discharge status: Home / Self Care | Source: Ambulatory Visit | Attending: Neurosurgery | Admitting: Neurosurgery

## 2024-01-22 ENCOUNTER — Encounter: Payer: Self-pay | Admitting: Urgent Care

## 2024-01-22 ENCOUNTER — Inpatient Hospital Stay: Admission: RE | Admit: 2024-01-22

## 2024-01-22 ENCOUNTER — Other Ambulatory Visit: Payer: Self-pay

## 2024-01-22 HISTORY — DX: Atherosclerotic heart disease of native coronary artery without angina pectoris: I25.10

## 2024-01-22 NOTE — H&P (View-Only) (Signed)
 HISTORY OF PRESENT ILLNESS: Adrian Bernard is a 68 y.o. male with past medical history of  HLP, Moderate coronary calcifications, Aortic atherosclerosis, Thoracic aortic aneurism, ascending aorta 4.4cm from Aug, 2025, 4.3cm from Sept, 2022, Overweight, BMI 27.2, DM type 2, uncontrolled, HgbA1c 10.4% from April, 2025, 6.9% from Oct, 2025, COPD, emphysema, Vit D deficiency, Multiple thyroid nodules, H/o colon polyps, H/o elevated LFTs, Peripheral polyneuropathy, Cervical spondylosis with myelopathy, Cervical radiculopathy, Lumbar radiculopathy, Bilateral metatarsalgia of feet, Restless leg syndrome, OSA,  Tobacco abuse, presented for evaluation for preop evaluation prior to placement of spinal stimulation with known risk factors with moderate coronary artery calcifications and thoracic aortic aneurysm.   Patient has known history of stable small ascending aortic aneurysm measuring 4.4 cm. Patient has history of moderate coronary artery calcifications and aortic atherosclerosis.   Patient is going to have placement of spinal stimulator on February 05, 2024 and patient was referred for cardiologist evaluation.   Bilateral feet pain secondary to metatarsalgia of feet is bothering the patient.   Patient denies chest pain, shortness of breath, palpitations, near syncope or syncope, orthopnoe or PND.   Patient can walk 1/2 mile without problems if bilateral feet pain not bothering him.  Because of bilateral feet pain due to metatarsalgia patient can walk only 100 to 200 yards.  Patient doesn't exercises regularly. Patient follows with foot doctor.   Patient cannot say if he snores or not. Patient has obstructive sleep apnea diagnosed several years ago. Patient tried CPAP at night but could not tolerate it and does not use it.   Patient has gained 30-40 pounds over the last year and weight is now 169 pounds with BMI 26.5. Prior the patient lost 30-40 pounds over the year. Per  patient over the last few years his weight is stable around 170 pounds.   Patient doesn't drink enough water daily and drinks only 32 ounces. Patient drinks only 1 cup of coffee daily. Patient drinks Gatorade Zero 12 ounces every other day. Patient doesn't drink Energy drinks.   BP and HR are under control today 116/62 with HR 81-89. Per patient BP remains normal at home.  No history of hypertension.   ECG today on January 10, 2024- Normal sinus rhythm , HR 81 Normal ECG When compared with ECG of 25-May-2020 09:27, or August 02, 2022 No significant change was found    I have personally reviewed ECG from  August 02, 2022. My interpretation- Sinus Rhythm, HR 66 Early repolarization changes.  WITHIN NORMAL LIMITS   I have personally reviewed ECG from  May 25, 2020. My  NSR, HR 65 Normal ECG   Chest x-ray from Aug, 2024 - 1. No acute cardiopulmonary disease.  2. Emphysema.    CT chest from Sep 28, 2023 - No suspicious pulmonary nodules.  Mild diffuse bronchial thickening.  Moderate scarring in the lung apices.  Previously noted tree-in-bud opacities in the left upper lobe and lingula have resolved.  Moderate coronary calcifications. No adenopathy.  Dilated ascending aorta 4.4 cm compare to previous measured 4.3 cm in September 2022.  Main pulmonary artery normal in size.  Heart size normal. No pericardial effusion.    Chest CTA from Nov 19, 2021 - 1. Negative for acute PE or thoracic aortic dissection.  2. Clustered nodular airspace opacities in the right middle lobe and lingula, likely infectious/inflammatory.  3. 4 cm ascending thoracic aortic aneurysm. Recommend annual imaging followup by CTA or MRA. This recommendation follows 2010 ACCF/AHA/AATS/ACR/ASA/SCA/SCAI/SIR/STS/SVM Guidelines for the Diagnosis  and Management of Patients with Thoracic Aortic Disease. Circulation. 2010; 121: z733-z630  4. Coronary and Aortic Atherosclerosis.   Chest CT w/o contrast from Sept, 2022 -  pulmonary nodules. Mild coronary calcifications. No adenopathy.  Fusiform ectasia of the ascending aorta measures 4.3 cm.    Right LE US  from July, 2022 - negative for DVT.   Head CT from Oct, 2023 - 1. No acute intracranial abnormalities.  2. Mild sinus inflammation.  3. No evidence for cervical spine fracture or subluxation.  4. Postop change from anterior cervical discectomy and interbody fusion at C3-4 and C4-5.    Recent blood tests from this month, Jan 01, 2024 - Na 137, K 4.6, creat 0.83, LFTs normal, GFR 97. Glucose 160. WBC 8.4, Hgb 13.3, MCV 89, plats 335.   From Oct, 2025 - HgbA1c 6.9%.   From April, 2025 - LDL 76, HDL 36, TG 203, TC 146.  HgbA1c 10.4%. TSH 1.97. glucose 245.   From Aug, 2024 - INR 1.0.   Patient smokes 1 pack of cigarettes a day. No history of ETOH or drug abuse.   No Family history of early CAD or family history of sudden cardiac death.   Patient's brother had MI at age 67. Another brother had MI at age 24.       MEDICAL HISTORY: [Past Medical History]  [Past Medical History]     Diagnosis Date   Arthritis     Diabetes mellitus (*)     Hyperlipidemia     Hypertension     Neuropathy     Obstructive sleep apnea on CPAP      waiting for new mask   Restless leg syndrome       SURGICAL HISTORY: [Past Surgical History]  [Past Surgical History]      Procedure Laterality Date   Colonoscopy       Mouth surgery        dental implants   Spinal fusion        surgery 2022     ALLERGIES: [Allergies]  [Allergies] No Known Allergies   MEDICATIONS: [Current Medications]  [Current Medications]       Current Outpatient Medications  Medication Sig Dispense Refill   ACCU-CHEK FASTCLIX LANCETS MISC Use to monitor blood glucose 2 time(s) daily 100 each 5   Alpha-Lipoic Acid 600 MG TABS Take 1 tablet by mouth daily.       Ascorbic Acid (VITAMIN C) 1000 MG tablet Take one tablet (1,000 mg dose) by mouth daily.       aspirin   81 MG EC tablet Take one tablet (81 mg dose) by mouth daily.       azithromycin (ZITHROMAX) 250 mg tablet 2 tablets on day 1 and 1 tablet on days 2-5 6 tablet 0   Blood Glucose Monitoring Suppl (ACCU-CHEK GUIDE) w/Device KIT 1 each by Does not apply route daily. Use as directed by physician to monitor blood sugars 1 kit 0   Continuous Glucose Sensor (FREESTYLE LIBRE 3 PLUS SENSOR) MISC 1 each by Does not apply route see administration instructions. Replace every 15 days. 6 each 3   gabapentin (NEURONTIN) 100 mg capsule TAKE 1 CAPSULE(100 MG) BY MOUTH THREE TIMES DAILY 90 capsule 1   glucose blood (ACCU-CHEK GUIDE) test strip one strip (1 each dose) by Other route 3 (three) times a day. 100 each 5   HYDROcodone -acetaminophen  (NORCO) 5-325 mg per tablet Take one tablet by mouth every 6 (six) hours as needed.  ibuprofen  (ADVIL ,MOTRIN ) 800 mg tablet Take one tablet (800 mg dose) by mouth 2 (two) times a day as needed for Pain for up to 30 days. 60 tablet 1   Insulin Glargine (TOUJEO SOLOSTAR) 300 UNIT/ML SOPN Inject sixteen Units into the skin daily. 9 mL 3   Insulin Pen Needle (BD,SURE COMFORT,NOVOFINE) 32G X 4 MM MISC one each by Does not apply route daily. 100 each 3   lidocaine  (LIDODERM ) 5% Place one patch onto the skin daily.       metformin (GLUCOPHAGE) 1000 MG tablet Take one tablet (1,000 mg dose) by mouth 2 (two) times a day with meals. 180 tablet 3   Misc. Devices MISC Start CPAP at 7 cm. water pressure.  Prefer Resmed S11 CPAP machine with a mask and supplies for severe OSA with AHI 33.  Res Med N30 Nasal mask size Medium or patient preference.  Send to a local DME. 1 each 0   Multiple Vitamins-Minerals (MENS 50+ MULTI VITAMIN/MIN PO) Take by mouth.       nortriptyline HCl (PAMELOR) 10 mg capsule TAKE 3 CAPSULES(30 MG) BY MOUTH AT BEDTIME 90 capsule 0   nortriptyline HCl (PAMELOR) 10 mg capsule Take three capsules (30 mg dose) by mouth at bedtime. 90 capsule 0    omeprazole (PRILOSEC) 20 mg capsule Take one capsule (20 mg dose) by mouth daily. For 2 weeks for acid reflux 30 capsule 0   rosuvastatin calcium (CRESTOR) 20 mg tablet TAKE 1 TABLET(20 MG) BY MOUTH DAILY 90 tablet 3   Semaglutide, 2 MG/DOSE, (OZEMPIC, 2 MG/DOSE,) 8 MG/3ML SOPN pen INJECT 2 MG INTO THE SKIN ONCE EVERY WEEK 9 mL 3   sildenafil citrate (VIAGRA) 100 mg tablet Take one tablet (100 mg dose) by mouth as needed for Erectile Dysfunction (take on empty stomach before the evening meal). 30 tablet 5   tadalafil (CIALIS) 20 MG tablet Take one tablet (20 mg dose) by mouth daily as needed for Erectile Dysfunction. 90 tablet 1   vitamin B-12 (CYANOCOBALAMIN) 1000 mcg tablet Take one tablet (1,000 mcg dose) by mouth daily.        No current facility-administered medications for this visit.       FAMILY HISTORY: [Family History]  [Family History]       Problem Relation Name Age of Onset   Cancer Sister       Diabetes Brother jamal     Heart attack Brother          Father       Heart disease Brother jamal     Hypertension Brother teacher, english as a foreign language     Kidney disease Brother jamal     No Known Problems Mother       Stroke Neg Hx         SOCIAL HISTORY: [Social History]  [Social History]      Socioeconomic History   Marital status: Married  Tobacco Use   Smoking status: Every Day      Current packs/day: 1.00      Average packs/day: 1 pack/day for 40.0 years (40.0 ttl pk-yrs)      Types: Cigarettes      Passive exposure: Past   Smokeless tobacco: Former   Tobacco comments:      1 or 2  a day  Vaping Use   Vaping status: Never Used  Substance and Sexual Activity   Alcohol use: Never   Drug use: Never   Sexual activity: Yes      Partners: Female  Birth control/protection: None     REVIEW OF SYSTEMS:The patient denies edema, cough, near syncope or syncope, nausea, vomiting, diarrhea, fever, chills, or bleeding. All other systems are reviewed and are  negative except for that mentioned in the history of present illness.     PHYSICAL EXAM:   Vital Signs: BP 116/62 (BP Location: Left Upper Arm, Patient Position: Sitting)   Pulse 89   Ht 5' 7 (1.702 m)   Wt 169 lb 4.8 oz (76.8 kg)   SpO2 98%   BMI 26.52 kg/m  Constitutional:  Well nourished, well developed patient in no apparent distress. HENT:normocephalic,atraumatic, oropharynx moist,no oral exudates,nose normal. Neck: Supple. No JVD. Carotid bruits: Absent. Eyes: Conjunctiva normal, no discharge. Lymphatic:no lymphadenopathy noted. Cardiovascular: RRR. S1, S2. 1/6 systolic murmur at the left sternal border. No gallop. No rub. Respiratory: Mildly decreased breath sounds to auscultation bilaterally.  No wheezing, no rales. GI: Soft, nontender, nondistended, normal bowel sounds, no masses or hepatosplenomegaly.   Skin: Warm, dry, no erythema, no rash. Musculoskeletal: No edema, no tenderness, no cyanosis, no clubbing. Pulses: 1+ and intact bilaterally. Neurological:  Neuro exam is grossly normal with no focal deficits noted. Psychiatric: Patient is alert and oriented to person, place and time, normal affect.   No results found.   Recent Results        Recent Results (from the past 24 hours)  ECG 12 lead    Collection Time: 01/10/24  9:11 AM  Result Value Ref Range    Acquisition Device [id number]      Ventricular Rate 81 BPM    Atrial Rate 81 BPM    P-R Interval 174 ms    QRS Duration 82 ms    Q-T Interval 342 ms    QTC Calculation(Bazett) 397 ms    Calculated P Axis 72 degrees    Calculated R Axis 44 degrees    Calculated T Axis 90 degrees    ECG Diagnosis          Normal sinus rhythm , HR 81 Normal ECG When compared with ECG of 25-May-2020 09:27, No significant change was found Fredrica Lipoma ([id number]) on 01/10/2024 9:17:22 AM certifies that he/she has reviewed the ECG tracing and confirms the independent interpretation is correct.             Lab Results   Component Value Date    Cholesterol, Total 146 05/09/2023         Lab Results  Component Value Date    HDL 36 (L) 05/09/2023    No components found for: [facility]      Lab Results  Component Value Date    Triglycerides 203 (H) 05/09/2023    No results found for: [facility]      Lab Results  Component Value Date    Creatinine 0.83 01/01/2024    BUN 20 01/01/2024    Sodium 137 01/01/2024    Potassium 4.6 01/01/2024    Chloride 100 01/01/2024    CO2 21 01/01/2024    No results found for: CK[facility], TROPONINI      Lab Results  Component Value Date    Hemoglobin 13.3 01/01/2024         Lab Results  Component Value Date    INR 0.9 04/04/2021          ASSESSMENT: [Problem List]   [Problem List] Patient Active Problem List    Diagnosis   Hyperlipidemia   Restless legs   Pain   Erectile dysfunction   Thyroid  nodule   OSA (obstructive sleep apnea)   Cervical spondylosis with myelopathy   Hyperlipidemia associated with type 2 diabetes mellitus (*)   Multiple thyroid nodules   Spondylosis without myelopathy or radiculopathy, lumbar region   Tobacco abuse   Cervical radiculopathy   Arthralgia   Peripheral polyneuropathy   Metatarsalgia of both feet   Sesamoiditis of left foot   Nocturia   History of fall   Thoracic aortic aneurysm   Vitamin D deficiency   Paresthesia of foot, bilateral   Type 2 diabetes mellitus with diabetic neuropathy, without long-term current use of insulin (*)   Lumbar radiculopathy   History of colon polyps     Adrian Bernard is a 69 y.o. male with past medical history as above presented for evaluation for preop evaluation prior to placement of spinal stimulation with known risk factors with moderate coronary artery calcifications and thoracic aortic aneurysm.   Preop evaluation prior to placement of spinal stimulator planned for February 05, 2024 - patient denies chest pain, shortness of breath,  palpitations.   He has very limited mobility due to bilateral metatarsalgia feet and he can walk only 100 to 200 yards.   Patient has risk factors for CAD-IDDM type II, smoking, age, hyperlipidemia. Patient has known history of moderate coronary artery calcifications. I will get cardiac echo to reevaluate heart function and structure.  I will get coronary CTA to evaluate for CAD. If above workup is unremarkable patient could proceed with spinal stimulator placement on February 05, 2024-considering comorbidities patient has some risk of perioperative ischemic events but if above workup is unremarkable the risk would not be high.   I would avoid fluctuation of the blood pressure or heart rate during surgery.   I would keep potassium between 4.0 and 5.0 and magnesium  between 2.0 and 2.5.     Moderate coronary artery calcifications-patient denies chest pain, dyspnea, palpitations, near-syncope or syncope. BP and HR are under control. Bilateral feet metatarsalgia is limiting his mobility significantly.  If noted bilateral feet pain as per patient he could walk half a mile. I will get cardiac echo to reevaluate heart function and structure.  I will get coronary CTA to evaluate for CAD. Patient takes aspirin  81 mg and rosuvastatin 20 mg.   Small thoracic aortic aneurysm -ascending aorta apparently measured 4.3 cm by previous CT from September, 2022. From October 2023 chest CT revealed ascending aortic aneurysm 4.0 cm. From August 2025 stable thoracic aortic aneurysm measuring 4.4 cm noted by CT as above. BP is under control.  Heart rate is under control today but patient does not measure her heart rate at home.  I strongly encouraged patient to start recording BP and HR and let me know.   Hyperlipidemia-patient is on rosuvastatin 20mg  per PCPs suggestion.  The goal of LDL cholesterol less than 899, HDL cholesterol more than 45, Triglycerides less than 150.  Recent lipid panel from April 2025 was  mildly abnormal with LDL at goal 76 but HDL slightly low 36 and TG slightly elevated 203 as above.  See overweight below. Further management of HLP as per PCP.   Smoking - Patient smokes 1 pack of cigarettes a day. I strongly encouraged patient to quit smoking. I suggested to consider nicotine patches.  Patient is going to consider nicotine patches if he can afford them.  Smoking cessation counseling-3 minutes.   OSA - Patient cannot say if he snores or not. Patient has obstructive sleep apnea diagnosed several years ago.  Patient tried CPAP at night but could not tolerate it and does not use it. Patient does not follow with sleep physician. I strongly encouraged patient to discuss with sleep physician further management of sleep apnea and if he cannot tolerate CPAP consider mouthguard device for inspire device.  Patient is interested.  Will give referral to sleep clinic. See overweight below.   Overweight -Patient has gained 30-40 pounds over the last year and weight is now 169 pounds with BMI 26.5. Prior the patient lost 30-40 pounds over the year.  I strongly encouraged patient to stay on low salt, Mediterranean diet, walk daily some and lose weight.   Family history of CAD but not early CAD as above- noted.     PLAN:   Follow up lipid panel and liver function tests as per your PCP in 2-3 months.   Please, Quit smoking as soon as you can. Consider nicotine patches.   Continue other current medications.   Cardiac echo.   Coronary CTA.   Sleep physician evaluation.   Keep checking blood pressure and heart rate daily and record it and let us  or primary care physician know if any abnormal readings noted. Please check blood pressure and heart rate after sitting and relaxing for 5 minutes or more. The goal of systolic blood pressure 100-130, diastolic BP 60-85, HR 60-80.   Please stay on low salt, Mediterranean diet, walk daily and avoid weight gain. Please see the list of  Mediterranean diet below.    Drink plenty of water - about 60-64 ounces or more a day - to avoid dehydration and volume depletion.   Please avoid caffeine.   Avoid any strenuous activities.   Please avoid alcohol.   If you develop significant chest pain, shortness of breath, palpitations, near-syncope or syncope or lightheadedness, please call 911 and be transferred to emergency room for evaluation.   Follow up in 3 months or earlier if needed.   Care plan and follow-up as discussed or as needed if any worsening symptoms or change in condition. Pt expressed understanding. No barriers to meeting goals. After visit summary was given to the patient.  ADDENDUM:  Coronary CT revealed severe proximal LAD stenosis 70 to 99% stenosis and the study was sent for FFR.  Otherwise mild nonobstructive CAD. We contacted the patient and suggested cardiac cath for definite evaluation for CAD and possible intervention.  Patient would like to proceed with cardiac cath on Friday, January 25, 2024. Will schedule patient with Dr. Lilian on January 25, 2024.     Youlanda Juba, MD, PhD

## 2024-01-22 NOTE — H&P (Signed)
 HISTORY OF PRESENT ILLNESS: Adrian Bernard is a 68 y.o. male with past medical history of  HLP, Moderate coronary calcifications, Aortic atherosclerosis, Thoracic aortic aneurism, ascending aorta 4.4cm from Aug, 2025, 4.3cm from Sept, 2022, Overweight, BMI 27.2, DM type 2, uncontrolled, HgbA1c 10.4% from April, 2025, 6.9% from Oct, 2025, COPD, emphysema, Vit D deficiency, Multiple thyroid nodules, H/o colon polyps, H/o elevated LFTs, Peripheral polyneuropathy, Cervical spondylosis with myelopathy, Cervical radiculopathy, Lumbar radiculopathy, Bilateral metatarsalgia of feet, Restless leg syndrome, OSA,  Tobacco abuse, presented for evaluation for preop evaluation prior to placement of spinal stimulation with known risk factors with moderate coronary artery calcifications and thoracic aortic aneurysm.   Patient has known history of stable small ascending aortic aneurysm measuring 4.4 cm. Patient has history of moderate coronary artery calcifications and aortic atherosclerosis.   Patient is going to have placement of spinal stimulator on February 05, 2024 and patient was referred for cardiologist evaluation.   Bilateral feet pain secondary to metatarsalgia of feet is bothering the patient.   Patient denies chest pain, shortness of breath, palpitations, near syncope or syncope, orthopnoe or PND.   Patient can walk 1/2 mile without problems if bilateral feet pain not bothering him.  Because of bilateral feet pain due to metatarsalgia patient can walk only 100 to 200 yards.  Patient doesn't exercises regularly. Patient follows with foot doctor.   Patient cannot say if he snores or not. Patient has obstructive sleep apnea diagnosed several years ago. Patient tried CPAP at night but could not tolerate it and does not use it.   Patient has gained 30-40 pounds over the last year and weight is now 169 pounds with BMI 26.5. Prior the patient lost 30-40 pounds over the year. Per  patient over the last few years his weight is stable around 170 pounds.   Patient doesn't drink enough water daily and drinks only 32 ounces. Patient drinks only 1 cup of coffee daily. Patient drinks Gatorade Zero 12 ounces every other day. Patient doesn't drink Energy drinks.   BP and HR are under control today 116/62 with HR 81-89. Per patient BP remains normal at home.  No history of hypertension.   ECG today on January 10, 2024- Normal sinus rhythm , HR 81 Normal ECG When compared with ECG of 25-May-2020 09:27, or August 02, 2022 No significant change was found    I have personally reviewed ECG from  August 02, 2022. My interpretation- Sinus Rhythm, HR 66 Early repolarization changes.  WITHIN NORMAL LIMITS   I have personally reviewed ECG from  May 25, 2020. My  NSR, HR 65 Normal ECG   Chest x-ray from Aug, 2024 - 1. No acute cardiopulmonary disease.  2. Emphysema.    CT chest from Sep 28, 2023 - No suspicious pulmonary nodules.  Mild diffuse bronchial thickening.  Moderate scarring in the lung apices.  Previously noted tree-in-bud opacities in the left upper lobe and lingula have resolved.  Moderate coronary calcifications. No adenopathy.  Dilated ascending aorta 4.4 cm compare to previous measured 4.3 cm in September 2022.  Main pulmonary artery normal in size.  Heart size normal. No pericardial effusion.    Chest CTA from Nov 19, 2021 - 1. Negative for acute PE or thoracic aortic dissection.  2. Clustered nodular airspace opacities in the right middle lobe and lingula, likely infectious/inflammatory.  3. 4 cm ascending thoracic aortic aneurysm. Recommend annual imaging followup by CTA or MRA. This recommendation follows 2010 ACCF/AHA/AATS/ACR/ASA/SCA/SCAI/SIR/STS/SVM Guidelines for the Diagnosis  and Management of Patients with Thoracic Aortic Disease. Circulation. 2010; 121: z733-z630  4. Coronary and Aortic Atherosclerosis.   Chest CT w/o contrast from Sept, 2022 -  pulmonary nodules. Mild coronary calcifications. No adenopathy.  Fusiform ectasia of the ascending aorta measures 4.3 cm.    Right LE US  from July, 2022 - negative for DVT.   Head CT from Oct, 2023 - 1. No acute intracranial abnormalities.  2. Mild sinus inflammation.  3. No evidence for cervical spine fracture or subluxation.  4. Postop change from anterior cervical discectomy and interbody fusion at C3-4 and C4-5.    Recent blood tests from this month, Jan 01, 2024 - Na 137, K 4.6, creat 0.83, LFTs normal, GFR 97. Glucose 160. WBC 8.4, Hgb 13.3, MCV 89, plats 335.   From Oct, 2025 - HgbA1c 6.9%.   From April, 2025 - LDL 76, HDL 36, TG 203, TC 146.  HgbA1c 10.4%. TSH 1.97. glucose 245.   From Aug, 2024 - INR 1.0.   Patient smokes 1 pack of cigarettes a day. No history of ETOH or drug abuse.   No Family history of early CAD or family history of sudden cardiac death.   Patient's brother had MI at age 67. Another brother had MI at age 24.       MEDICAL HISTORY: [Past Medical History]  [Past Medical History]     Diagnosis Date   Arthritis     Diabetes mellitus (*)     Hyperlipidemia     Hypertension     Neuropathy     Obstructive sleep apnea on CPAP      waiting for new mask   Restless leg syndrome       SURGICAL HISTORY: [Past Surgical History]  [Past Surgical History]      Procedure Laterality Date   Colonoscopy       Mouth surgery        dental implants   Spinal fusion        surgery 2022     ALLERGIES: [Allergies]  [Allergies] No Known Allergies   MEDICATIONS: [Current Medications]  [Current Medications]       Current Outpatient Medications  Medication Sig Dispense Refill   ACCU-CHEK FASTCLIX LANCETS MISC Use to monitor blood glucose 2 time(s) daily 100 each 5   Alpha-Lipoic Acid 600 MG TABS Take 1 tablet by mouth daily.       Ascorbic Acid (VITAMIN C) 1000 MG tablet Take one tablet (1,000 mg dose) by mouth daily.       aspirin   81 MG EC tablet Take one tablet (81 mg dose) by mouth daily.       azithromycin (ZITHROMAX) 250 mg tablet 2 tablets on day 1 and 1 tablet on days 2-5 6 tablet 0   Blood Glucose Monitoring Suppl (ACCU-CHEK GUIDE) w/Device KIT 1 each by Does not apply route daily. Use as directed by physician to monitor blood sugars 1 kit 0   Continuous Glucose Sensor (FREESTYLE LIBRE 3 PLUS SENSOR) MISC 1 each by Does not apply route see administration instructions. Replace every 15 days. 6 each 3   gabapentin (NEURONTIN) 100 mg capsule TAKE 1 CAPSULE(100 MG) BY MOUTH THREE TIMES DAILY 90 capsule 1   glucose blood (ACCU-CHEK GUIDE) test strip one strip (1 each dose) by Other route 3 (three) times a day. 100 each 5   HYDROcodone -acetaminophen  (NORCO) 5-325 mg per tablet Take one tablet by mouth every 6 (six) hours as needed.  ibuprofen  (ADVIL ,MOTRIN ) 800 mg tablet Take one tablet (800 mg dose) by mouth 2 (two) times a day as needed for Pain for up to 30 days. 60 tablet 1   Insulin Glargine (TOUJEO SOLOSTAR) 300 UNIT/ML SOPN Inject sixteen Units into the skin daily. 9 mL 3   Insulin Pen Needle (BD,SURE COMFORT,NOVOFINE) 32G X 4 MM MISC one each by Does not apply route daily. 100 each 3   lidocaine  (LIDODERM ) 5% Place one patch onto the skin daily.       metformin (GLUCOPHAGE) 1000 MG tablet Take one tablet (1,000 mg dose) by mouth 2 (two) times a day with meals. 180 tablet 3   Misc. Devices MISC Start CPAP at 7 cm. water pressure.  Prefer Resmed S11 CPAP machine with a mask and supplies for severe OSA with AHI 33.  Res Med N30 Nasal mask size Medium or patient preference.  Send to a local DME. 1 each 0   Multiple Vitamins-Minerals (MENS 50+ MULTI VITAMIN/MIN PO) Take by mouth.       nortriptyline HCl (PAMELOR) 10 mg capsule TAKE 3 CAPSULES(30 MG) BY MOUTH AT BEDTIME 90 capsule 0   nortriptyline HCl (PAMELOR) 10 mg capsule Take three capsules (30 mg dose) by mouth at bedtime. 90 capsule 0    omeprazole (PRILOSEC) 20 mg capsule Take one capsule (20 mg dose) by mouth daily. For 2 weeks for acid reflux 30 capsule 0   rosuvastatin calcium (CRESTOR) 20 mg tablet TAKE 1 TABLET(20 MG) BY MOUTH DAILY 90 tablet 3   Semaglutide, 2 MG/DOSE, (OZEMPIC, 2 MG/DOSE,) 8 MG/3ML SOPN pen INJECT 2 MG INTO THE SKIN ONCE EVERY WEEK 9 mL 3   sildenafil citrate (VIAGRA) 100 mg tablet Take one tablet (100 mg dose) by mouth as needed for Erectile Dysfunction (take on empty stomach before the evening meal). 30 tablet 5   tadalafil (CIALIS) 20 MG tablet Take one tablet (20 mg dose) by mouth daily as needed for Erectile Dysfunction. 90 tablet 1   vitamin B-12 (CYANOCOBALAMIN) 1000 mcg tablet Take one tablet (1,000 mcg dose) by mouth daily.        No current facility-administered medications for this visit.       FAMILY HISTORY: [Family History]  [Family History]       Problem Relation Name Age of Onset   Cancer Sister       Diabetes Brother jamal     Heart attack Brother          Father       Heart disease Brother jamal     Hypertension Brother teacher, english as a foreign language     Kidney disease Brother jamal     No Known Problems Mother       Stroke Neg Hx         SOCIAL HISTORY: [Social History]  [Social History]      Socioeconomic History   Marital status: Married  Tobacco Use   Smoking status: Every Day      Current packs/day: 1.00      Average packs/day: 1 pack/day for 40.0 years (40.0 ttl pk-yrs)      Types: Cigarettes      Passive exposure: Past   Smokeless tobacco: Former   Tobacco comments:      1 or 2  a day  Vaping Use   Vaping status: Never Used  Substance and Sexual Activity   Alcohol use: Never   Drug use: Never   Sexual activity: Yes      Partners: Female  Birth control/protection: None     REVIEW OF SYSTEMS:The patient denies edema, cough, near syncope or syncope, nausea, vomiting, diarrhea, fever, chills, or bleeding. All other systems are reviewed and are  negative except for that mentioned in the history of present illness.     PHYSICAL EXAM:   Vital Signs: BP 116/62 (BP Location: Left Upper Arm, Patient Position: Sitting)   Pulse 89   Ht 5' 7 (1.702 m)   Wt 169 lb 4.8 oz (76.8 kg)   SpO2 98%   BMI 26.52 kg/m  Constitutional:  Well nourished, well developed patient in no apparent distress. HENT:normocephalic,atraumatic, oropharynx moist,no oral exudates,nose normal. Neck: Supple. No JVD. Carotid bruits: Absent. Eyes: Conjunctiva normal, no discharge. Lymphatic:no lymphadenopathy noted. Cardiovascular: RRR. S1, S2. 1/6 systolic murmur at the left sternal border. No gallop. No rub. Respiratory: Mildly decreased breath sounds to auscultation bilaterally.  No wheezing, no rales. GI: Soft, nontender, nondistended, normal bowel sounds, no masses or hepatosplenomegaly.   Skin: Warm, dry, no erythema, no rash. Musculoskeletal: No edema, no tenderness, no cyanosis, no clubbing. Pulses: 1+ and intact bilaterally. Neurological:  Neuro exam is grossly normal with no focal deficits noted. Psychiatric: Patient is alert and oriented to person, place and time, normal affect.   No results found.   Recent Results        Recent Results (from the past 24 hours)  ECG 12 lead    Collection Time: 01/10/24  9:11 AM  Result Value Ref Range    Acquisition Device [id number]      Ventricular Rate 81 BPM    Atrial Rate 81 BPM    P-R Interval 174 ms    QRS Duration 82 ms    Q-T Interval 342 ms    QTC Calculation(Bazett) 397 ms    Calculated P Axis 72 degrees    Calculated R Axis 44 degrees    Calculated T Axis 90 degrees    ECG Diagnosis          Normal sinus rhythm , HR 81 Normal ECG When compared with ECG of 25-May-2020 09:27, No significant change was found Fredrica Lipoma ([id number]) on 01/10/2024 9:17:22 AM certifies that he/she has reviewed the ECG tracing and confirms the independent interpretation is correct.             Lab Results   Component Value Date    Cholesterol, Total 146 05/09/2023         Lab Results  Component Value Date    HDL 36 (L) 05/09/2023    No components found for: [facility]      Lab Results  Component Value Date    Triglycerides 203 (H) 05/09/2023    No results found for: [facility]      Lab Results  Component Value Date    Creatinine 0.83 01/01/2024    BUN 20 01/01/2024    Sodium 137 01/01/2024    Potassium 4.6 01/01/2024    Chloride 100 01/01/2024    CO2 21 01/01/2024    No results found for: [facility], TROPONINI      Lab Results  Component Value Date    Hemoglobin 13.3 01/01/2024         Lab Results  Component Value Date    INR 0.9 04/04/2021          ASSESSMENT: [Problem List]   [Problem List] Patient Active Problem List    Diagnosis   Hyperlipidemia   Restless legs   Pain   Erectile dysfunction   Thyroid  nodule   OSA (obstructive sleep apnea)   Cervical spondylosis with myelopathy   Hyperlipidemia associated with type 2 diabetes mellitus (*)   Multiple thyroid nodules   Spondylosis without myelopathy or radiculopathy, lumbar region   Tobacco abuse   Cervical radiculopathy   Arthralgia   Peripheral polyneuropathy   Metatarsalgia of both feet   Sesamoiditis of left foot   Nocturia   History of fall   Thoracic aortic aneurysm   Vitamin D deficiency   Paresthesia of foot, bilateral   Type 2 diabetes mellitus with diabetic neuropathy, without long-term current use of insulin (*)   Lumbar radiculopathy   History of colon polyps     Adrian Bernard is a 69 y.o. male with past medical history as above presented for evaluation for preop evaluation prior to placement of spinal stimulation with known risk factors with moderate coronary artery calcifications and thoracic aortic aneurysm.   Preop evaluation prior to placement of spinal stimulator planned for February 05, 2024 - patient denies chest pain, shortness of breath,  palpitations.   He has very limited mobility due to bilateral metatarsalgia feet and he can walk only 100 to 200 yards.   Patient has risk factors for CAD-IDDM type II, smoking, age, hyperlipidemia. Patient has known history of moderate coronary artery calcifications. I will get cardiac echo to reevaluate heart function and structure.  I will get coronary CTA to evaluate for CAD. If above workup is unremarkable patient could proceed with spinal stimulator placement on February 05, 2024-considering comorbidities patient has some risk of perioperative ischemic events but if above workup is unremarkable the risk would not be high.   I would avoid fluctuation of the blood pressure or heart rate during surgery.   I would keep potassium between 4.0 and 5.0 and magnesium  between 2.0 and 2.5.     Moderate coronary artery calcifications-patient denies chest pain, dyspnea, palpitations, near-syncope or syncope. BP and HR are under control. Bilateral feet metatarsalgia is limiting his mobility significantly.  If noted bilateral feet pain as per patient he could walk half a mile. I will get cardiac echo to reevaluate heart function and structure.  I will get coronary CTA to evaluate for CAD. Patient takes aspirin  81 mg and rosuvastatin 20 mg.   Small thoracic aortic aneurysm -ascending aorta apparently measured 4.3 cm by previous CT from September, 2022. From October 2023 chest CT revealed ascending aortic aneurysm 4.0 cm. From August 2025 stable thoracic aortic aneurysm measuring 4.4 cm noted by CT as above. BP is under control.  Heart rate is under control today but patient does not measure her heart rate at home.  I strongly encouraged patient to start recording BP and HR and let me know.   Hyperlipidemia-patient is on rosuvastatin 20mg  per PCPs suggestion.  The goal of LDL cholesterol less than 899, HDL cholesterol more than 45, Triglycerides less than 150.  Recent lipid panel from April 2025 was  mildly abnormal with LDL at goal 76 but HDL slightly low 36 and TG slightly elevated 203 as above.  See overweight below. Further management of HLP as per PCP.   Smoking - Patient smokes 1 pack of cigarettes a day. I strongly encouraged patient to quit smoking. I suggested to consider nicotine patches.  Patient is going to consider nicotine patches if he can afford them.  Smoking cessation counseling-3 minutes.   OSA - Patient cannot say if he snores or not. Patient has obstructive sleep apnea diagnosed several years ago.  Patient tried CPAP at night but could not tolerate it and does not use it. Patient does not follow with sleep physician. I strongly encouraged patient to discuss with sleep physician further management of sleep apnea and if he cannot tolerate CPAP consider mouthguard device for inspire device.  Patient is interested.  Will give referral to sleep clinic. See overweight below.   Overweight -Patient has gained 30-40 pounds over the last year and weight is now 169 pounds with BMI 26.5. Prior the patient lost 30-40 pounds over the year.  I strongly encouraged patient to stay on low salt, Mediterranean diet, walk daily some and lose weight.   Family history of CAD but not early CAD as above- noted.     PLAN:   Follow up lipid panel and liver function tests as per your PCP in 2-3 months.   Please, Quit smoking as soon as you can. Consider nicotine patches.   Continue other current medications.   Cardiac echo.   Coronary CTA.   Sleep physician evaluation.   Keep checking blood pressure and heart rate daily and record it and let us  or primary care physician know if any abnormal readings noted. Please check blood pressure and heart rate after sitting and relaxing for 5 minutes or more. The goal of systolic blood pressure 100-130, diastolic BP 60-85, HR 60-80.   Please stay on low salt, Mediterranean diet, walk daily and avoid weight gain. Please see the list of  Mediterranean diet below.    Drink plenty of water - about 60-64 ounces or more a day - to avoid dehydration and volume depletion.   Please avoid caffeine.   Avoid any strenuous activities.   Please avoid alcohol.   If you develop significant chest pain, shortness of breath, palpitations, near-syncope or syncope or lightheadedness, please call 911 and be transferred to emergency room for evaluation.   Follow up in 3 months or earlier if needed.   Care plan and follow-up as discussed or as needed if any worsening symptoms or change in condition. Pt expressed understanding. No barriers to meeting goals. After visit summary was given to the patient.  ADDENDUM:  Coronary CT revealed severe proximal LAD stenosis 70 to 99% stenosis and the study was sent for FFR.  Otherwise mild nonobstructive CAD. We contacted the patient and suggested cardiac cath for definite evaluation for CAD and possible intervention.  Patient would like to proceed with cardiac cath on Friday, January 25, 2024. Will schedule patient with Dr. Lilian on January 25, 2024.     Youlanda Juba, MD, PhD

## 2024-01-22 NOTE — Patient Instructions (Addendum)
 Your procedure is scheduled on: Tuesday 02/05/23 To find out your arrival time, please call [phone] between 1PM - 3PM on: Monday 02/04/23 Report to the Registration Desk on the 1st floor of the [facility]. If your arrival time is 6:00 am, do not arrive before that time as the [facility] entrance doors do not open until 6:00 am.  REMEMBER: Instructions that are not followed completely may result in serious medical risk, up to and including death; or upon the discretion of your surgeon and anesthesiologist your surgery may need to be rescheduled.  Do not eat food after midnight the night before surgery.  No gum chewing or hard candies.  You may however, drink CLEAR liquids up to 2 hours before you are scheduled to arrive for your surgery. Do not drink anything within 2 hours of your scheduled arrival time.  Clear liquids include: - water only  **Type 1 and Type 2 diabetics should only drink water.**  One week prior to surgery: Stop Anti-inflammatories (NSAIDS) such as Advil , Aleve , Ibuprofen , Motrin , Naproxen , Naprosyn  and Aspirin  based products such as Excedrin, Goody's Powder, BC Powder. Stop 01/25/24  You may however, continue to take Tylenol  if needed for pain up until the day of surgery.  Stop ANY OVER THE COUNTER supplements and vitamins for at least 7 days until after surgery. Stop 01/25/24  **Follow guidelines for insulin and diabetes medications.** Ozempic last dose 01/25/24. Metformin last dose 02/02/24. No Toujeo on 02/05/24  **Follow recommendations regarding stopping blood thinners.** Aspirin  last dose 01/28/24   Continue taking all of your other prescription medications up until the day of surgery.  ON THE DAY OF SURGERY ONLY TAKE THESE MEDICATIONS WITH SIPS OF WATER:  nortriptyline (PAMELOR) 10 MG capsule   No Alcohol for 24 hours before or after surgery.  No Smoking including e-cigarettes for 24 hours before surgery.  No chewable tobacco products for at least 6  hours before surgery.  No nicotine patches on the day of surgery.  Do not use any recreational drugs for at least a week (preferably 2 weeks) before your surgery.  Please be advised that the combination of cocaine and anesthesia may have negative outcomes, up to and including death. If you test positive for cocaine, your surgery will be cancelled.  On the morning of surgery brush your teeth with toothpaste and water, you may rinse your mouth with mouthwash if you wish. Do not swallow any toothpaste or mouthwash.  Use CHG Soap or wipes as directed on instruction sheet. (You can pick this up at our office in the [facility], the building to the left of the [employer], [address] at [address]  Do not shave body hair from the neck down 48 hours before surgery.  Do not wear lotions, powders, or perfumes.   Wear comfortable clothing (specific to your surgery type) to the hospital.  Do not wear jewelry, make-up, hairpins, clips or nail polish.  For welded (permanent) jewelry: bracelets, anklets, waist bands, etc.  Please have this removed prior to surgery.  If it is not removed, there is a chance that hospital personnel will need to cut it off on the day of surgery.  Contact lenses, hearing aids and dentures may not be worn into surgery.  Do not bring valuables to the hospital. [facility] is not responsible for any missing/lost belongings or valuables.   Notify your doctor if there is any change in your medical condition (cold, fever, infection).  After surgery, you can help prevent lung complications by doing breathing exercises.  Take deep breaths and cough every 1-2 hours. Your doctor may order a device called an Incentive Spirometer to help you take deep breaths.  If you are being discharged the day of surgery, you will not be allowed to drive home. You will need a responsible individual to drive you home and stay with you for 24 hours after surgery.    Please call the Pre-admissions Testing Dept. at [phone] if you have any questions about these instructions.  Surgery Visitation Policy:  Patients having surgery or a procedure may have two visitors.  Children under the age of 31 must have an adult with them who is not the patient.  Merchandiser, Retail to address health-related social needs:  [email or web address]                                                                                                             Preparing for Surgery with CHLORHEXIDINE GLUCONATE (CHG) Soap  Chlorhexidine Gluconate (CHG) Soap  o An antiseptic cleaner that kills germs and bonds with the skin to continue killing germs even after washing  o Used for showering the night before surgery and morning of surgery  Before surgery, you can play an important role by reducing the number of germs on your skin.  CHG (Chlorhexidine gluconate) soap is an antiseptic cleanser which kills germs and bonds with the skin to continue killing germs even after washing.  Please do not use if you have an allergy to CHG or antibacterial soaps. If your skin becomes reddened/irritated stop using the CHG.  1. Shower the NIGHT BEFORE SURGERY with CHG soap.  2. If you choose to wash your hair, wash your hair first as usual with your normal shampoo.  3. After shampooing, rinse your hair and body thoroughly to remove the shampoo.  4. Use CHG as you would any other liquid soap. You can apply CHG directly to the skin and wash gently with a clean washcloth.  5. Apply the CHG soap to your body only from the neck down. Do not use on open wounds or open sores. Avoid contact with your eyes, ears, mouth, and genitals (private parts). Wash face and genitals (private parts) with your normal soap.  6. Wash thoroughly, paying special attention to the area where your surgery will be performed.  7. Thoroughly rinse your body with warm water.  8. Do not  shower/wash with your normal soap after using and rinsing off the CHG soap.  9. Do not use lotions, oils, etc., after showering with CHG.  10. Pat yourself dry with a clean towel.  11. Wear clean pajamas to bed the night before surgery.  12. Place clean sheets on your bed the night of your shower and do not sleep with pets.  13. Do not apply any deodorants/lotions/powders.  14. Please wear clean clothes to the hospital.  15. Remember to brush your teeth with your regular toothpaste.

## 2024-01-25 NOTE — Interval H&P Note (Signed)
 H&P reviewed, patient examined and there is NO CHANGE to the patient status.

## 2024-01-25 NOTE — Nursing Note (Signed)
 AVS and discharge instructions, as well as follow up appointments, moderate sedation, procedural site infection s/s & prevention, hand hygiene, and restrictions reviewed with patient and support person. Heart Catheterization education handout provided to patient and reviewed, support person not present at this time for handout review. No questions or concerns at this time. Patient has had approximately 400cc to drink and a meal to eat. No nausea noted. Patient able to urinate at baseline post procedure. Patient was able to ambulate around unit with no dizziness/lightheadedness. VSS and no pain noted. Procedure site clean, dry, intact with no bleeding, swelling, bruising, or hematoma noted. Patient taken to private vehicle with support person driving via wheelchair accompanied by RN. Patient has all patient belongings. Patient able to transfer to vehicle from wheelchair at baseline.

## 2024-01-29 NOTE — Progress Notes (Signed)
 Cardiac Cath results and Clearance for Spinal Cord Stimulator  Imaging Results - documented in this encounter  HEART CATH-LEFT DIAGNOSTIC (01/25/2024 1:38 PM EST) Imaging Results - HEART CATH-LEFT DIAGNOSTIC (01/25/2024 1:38 PM EST) Anatomical Region Laterality Modality  Chest   X-Ray Angiography   Imaging Results - HEART CATH-LEFT DIAGNOSTIC (01/25/2024 1:38 PM EST) Specimen (Source) Anatomical Location / Laterality Collection Method / Volume Collection Time Received Time        01/25/2024 1:10 PM EST     Imaging Results - HEART CATH-LEFT DIAGNOSTIC (01/25/2024 1:38 PM EST) Impressions  01/25/2024 1:38 PM EST  Mid LAD-1 lesion is 15% stenosed.   Mid LAD-2 lesion is 15% stenosed.   Mid Cx lesion is 15% stenosed.   Prox RCA lesion is 15% stenosed.  The imaging is on file and stored in a permanent location.   DIAGNOSTIC CARDIAC CATHETERIZATION REPORT ___________________________________________________________________________ _ Findings: - Mild, non-obstructive coronary artery disease - Left ventricular filling pressures (LVEDP = 11 mm Hg). - Left ventricular contractile function (estimated LVEF = 55%).  Recommendations: - May proceed with surgical procedures at low risk  Complications: None  Minimal estimated blood loss (<15 cc) and no specimens obtained. ___________________________________________________________________________ _  Primary Care Physician: Luke MARLA Manns  Procedures performed: Coronary Angiography, Left Heart Catheterization  Access Site: Right Radial Artery Contrast Volume, Fluoroscopy Time, Radiation Dose: See procedure log  ___________________________________________________________________________ _ History/Pre-Procedure Diagnosis 68 year old Turkish male with coronary calcification on CT scan.  He is brought to cardiac catheterization as preoperative assessment.  Post-Procedure Diagnosis:  As above  Procedure Left Heart Catheterization  (Radial) Access via a 20F Termo Glidesheath in the right radial artery after lidocaine  2% anesthesia. Verapamil was given intra-arterially to prevent spasm. Anticoagulation: Heparin  Catheters used: Coronary arteries: 62F Tiger Heart catheterization: 62F pigtail  Closure: TR band protocol ___________________________________________________________________________ _  Hemodynamics and Left Heart Catheterization Aortic pressure: 120/52 mmHg Left ventricular filling pressure (LVEDP = 11 mm Hg).  There was no trans-aortic gradient on catheter pullback  ___________________________________________________________________________ _ Left Ventriculogram Left ventricular contractile function (estimated LVEF = >55%). No significant mitral regurgitation or wall motion abnormalities appreciated ___________________________________________________________________________ _  Coronary Angiography See diagram ___________________________________________________________________________ PHARES Sherron DELENA Lilian, MD 01/25/2024 / 1:36 PM  Under my direct supervision 1 milligrams of versed and 25 micrograms of Fentanyl   were administered intravenously for moderate sedation.  Pulse oximetry, heart rate, and blood pressure were continuously monitored by an independent trained observer who was present throughout the case.  The procedure start time was 1314 and the end time was 1338.     Imaging Results - HEART CATH-LEFT DIAGNOSTIC (01/25/2024 1:38 PM EST) Narrative  01/25/2024 1:38 PM EST  This result has an attachment that is not available.   Coronary Findings Diagnostic Dominance: Right Left Main: The vessel was visualized by angiography and is moderate in size. No atherosclerosis is present. Left Anterior Descending: Mid LAD-1 lesion is 15% stenosed. Mid LAD-2 lesion is 15% stenosed. Left Circumflex: Mid Cx lesion is 15% stenosed. Right Coronary Artery: Prox RCA lesion is 15%  stenosed.  Intervention No interventions have been documented.  Study Details Coronary artery calcification on CT scan    Imaging Results - HEART CATH-LEFT DIAGNOSTIC (01/25/2024 1:38 PM EST) Procedure Note  Lilian Sherron DELENA, MD / Provider Not, In System, MD - 01/25/2024  Formatting of this note is different from the original.   Mid LAD-1 lesion is 15% stenosed.  Mid LAD-2 lesion is 15% stenosed.  Mid Cx lesion is 15%  stenosed.  Prox RCA lesion is 15% stenosed.  The imaging is on file and stored in a permanent location.   DIAGNOSTIC CARDIAC CATHETERIZATION REPORT ____________________________________________________________________________ Findings: - Mild, non-obstructive coronary artery disease - Left ventricular filling pressures (LVEDP = 11 mm Hg). - Left ventricular contractile function (estimated LVEF = 55%).  Recommendations: - May proceed with surgical procedures at low risk  Complications: None  Minimal estimated blood loss (<15 cc) and no specimens obtained. ____________________________________________________________________________  Primary Care Physician: Luke MARLA Manns  Procedures performed: Coronary Angiography, Left Heart Catheterization  Access Site: Right Radial Artery Contrast Volume, Fluoroscopy Time, Radiation Dose: See procedure log  ____________________________________________________________________________ History/Pre-Procedure Diagnosis 68 year old Turkish male with coronary calcification on CT scan. He is brought to cardiac catheterization as preoperative assessment.  Post-Procedure Diagnosis: As above  Procedure Left Heart Catheterization (Radial) Access via a 19F Termo Glidesheath in the right radial artery after lidocaine  2% anesthesia. Verapamil was given intra-arterially to prevent spasm. Anticoagulation: Heparin  Catheters used: Coronary arteries: 8F Tiger Heart catheterization: 8F pigtail  Closure: TR band  protocol ____________________________________________________________________________  Hemodynamics and Left Heart Catheterization Aortic pressure: 120/52 mmHg Left ventricular filling pressure (LVEDP = 11 mm Hg).  There was no trans-aortic gradient on catheter pullback  ____________________________________________________________________________ Left Ventriculogram Left ventricular contractile function (estimated LVEF = >55%). No significant mitral regurgitation or wall motion abnormalities appreciated ____________________________________________________________________________  Coronary Angiography See diagram ____________________________________________________________________________ Sherron DELENA Champ, MD 01/25/2024 / 1:36 PM  Under my direct supervision 1 milligrams of versed and 25 micrograms of Fentanyl  were administered intravenously for moderate sedation. Pulse oximetry, heart rate, and blood pressure were continuously monitored by an independent trained observer who was present throughout the case. The procedure start time was 1314 and the end time was 1338.

## 2024-01-29 NOTE — Progress Notes (Signed)
 [facility]  [facility] Outside Information Adrian Juba, MD Physician Cardiology   [facility] [place])    Signed   Date of Service: 01/22/2024 10:52 AM Note Received: 01/28/2024 12:29 PM   Signed     HISTORY OF PRESENT ILLNESS: Adrian Bernard is a 68 y.o. male with past medical history of  HLP, Moderate coronary calcifications, Aortic atherosclerosis, Thoracic aortic aneurism, ascending aorta 4.4cm from Aug, 2025, 4.3cm from Sept, 2022, Overweight, BMI 27.2, DM type 2, uncontrolled, HgbA1c 10.4% from April, 2025, 6.9% from Oct, 2025, COPD, emphysema, Vit D deficiency, Multiple thyroid nodules, H/o colon polyps, H/o elevated LFTs, Peripheral polyneuropathy, Cervical spondylosis with myelopathy, Cervical radiculopathy, Lumbar radiculopathy, Bilateral metatarsalgia of feet, Restless leg syndrome, OSA,  Tobacco abuse, presented for evaluation for preop evaluation prior to placement of spinal stimulation with known risk factors with moderate coronary artery calcifications and thoracic aortic aneurysm.   Patient has known history of stable small ascending aortic aneurysm measuring 4.4 cm. Patient has history of moderate coronary artery calcifications and aortic atherosclerosis.   Patient is going to have placement of spinal stimulator on February 05, 2024 and patient was referred for cardiologist evaluation.   Bilateral feet pain secondary to metatarsalgia of feet is bothering the patient.   Patient denies chest pain, shortness of breath, palpitations, near syncope or syncope, orthopnoe or PND.   Patient can walk 1/2 mile without problems if bilateral feet pain not bothering him.  Because of bilateral feet pain due to metatarsalgia patient can walk only 100 to 200 yards.  Patient doesn't exercises regularly. Patient follows with foot doctor.   Patient cannot say if he snores or not. Patient has obstructive sleep apnea diagnosed  several years ago. Patient tried CPAP at night but could not tolerate it and does not use it.   Patient has gained 30-40 pounds over the last year and weight is now 169 pounds with BMI 26.5. Prior the patient lost 30-40 pounds over the year. Per patient over the last few years his weight is stable around 170 pounds.   Patient doesn't drink enough water daily and drinks only 32 ounces. Patient drinks only 1 cup of coffee daily. Patient drinks Gatorade Zero 12 ounces every other day. Patient doesn't drink Energy drinks.   BP and HR are under control today 116/62 with HR 81-89. Per patient BP remains normal at home.  No history of hypertension.   ECG today on January 10, 2024- Normal sinus rhythm , HR 81 Normal ECG When compared with ECG of 25-May-2020 09:27, or August 02, 2022 No significant change was found    I have personally reviewed ECG from  August 02, 2022. My interpretation- Sinus Rhythm, HR 66 Early repolarization changes.  WITHIN NORMAL LIMITS   I have personally reviewed ECG from  May 25, 2020. My  NSR, HR 65 Normal ECG   Chest x-ray from Aug, 2024 - 1. No acute cardiopulmonary disease.  2. Emphysema.    CT chest from Sep 28, 2023 - No suspicious pulmonary nodules.  Mild diffuse bronchial thickening.  Moderate scarring in the lung apices.  Previously noted tree-in-bud opacities in the left upper lobe and lingula have resolved.  Moderate coronary calcifications. No adenopathy.  Dilated ascending aorta 4.4 cm compare to previous measured 4.3 cm in September 2022.  Main pulmonary artery normal in size.  Heart size normal. No pericardial effusion.    Chest CTA from Nov 19, 2021 - 1. Negative for acute PE or  thoracic aortic dissection.  2. Clustered nodular airspace opacities in the right middle lobe and lingula, likely infectious/inflammatory.  3. 4 cm ascending thoracic aortic aneurysm. Recommend annual imaging followup by CTA or MRA. This recommendation follows 2010  ACCF/AHA/AATS/ACR/ASA/SCA/SCAI/SIR/STS/SVM Guidelines for the Diagnosis and Management of Patients with Thoracic Aortic Disease. Circulation. 2010; 121: z733-z630  4. Coronary and Aortic Atherosclerosis.   Chest CT w/o contrast from Sept, 2022 - pulmonary nodules. Mild coronary calcifications. No adenopathy.  Fusiform ectasia of the ascending aorta measures 4.3 cm.    Right LE US  from July, 2022 - negative for DVT.   Head CT from Oct, 2023 - 1. No acute intracranial abnormalities.  2. Mild sinus inflammation.  3. No evidence for cervical spine fracture or subluxation.  4. Postop change from anterior cervical discectomy and interbody fusion at C3-4 and C4-5.    Recent blood tests from this month, Jan 01, 2024 - Na 137, K 4.6, creat 0.83, LFTs normal, GFR 97. Glucose 160. WBC 8.4, Hgb 13.3, MCV 89, plats 335.   From Oct, 2025 - HgbA1c 6.9%.   From April, 2025 - LDL 76, HDL 36, TG 203, TC 146.  HgbA1c 10.4%. TSH 1.97. glucose 245.   From Aug, 2024 - INR 1.0.   Patient smokes 1 pack of cigarettes a day. No history of ETOH or drug abuse.   No Family history of early CAD or family history of sudden cardiac death.   Patient's brother had MI at age 108. Another brother had MI at age 75.       MEDICAL HISTORY: [Past Medical History]  [Past Medical History]        Diagnosis Date   Arthritis     Diabetes mellitus (*)     Hyperlipidemia     Hypertension     Neuropathy     Obstructive sleep apnea on CPAP      waiting for new mask   Restless leg syndrome        SURGICAL HISTORY: [Past Surgical History]  [Past Surgical History]          Procedure Laterality Date   Colonoscopy       Mouth surgery        dental implants   Spinal fusion        surgery 2022      ALLERGIES: [Allergies]  [Allergies] No Known Allergies   MEDICATIONS: [Current Medications]  [Current Medications]            Current Outpatient Medications  Medication Sig Dispense Refill   ACCU-CHEK  FASTCLIX LANCETS MISC Use to monitor blood glucose 2 time(s) daily 100 each 5   Alpha-Lipoic Acid 600 MG TABS Take 1 tablet by mouth daily.       Ascorbic Acid (VITAMIN C) 1000 MG tablet Take one tablet (1,000 mg dose) by mouth daily.       aspirin  81 MG EC tablet Take one tablet (81 mg dose) by mouth daily.       azithromycin (ZITHROMAX) 250 mg tablet 2 tablets on day 1 and 1 tablet on days 2-5 6 tablet 0   Blood Glucose Monitoring Suppl (ACCU-CHEK GUIDE) w/Device KIT 1 each by Does not apply route daily. Use as directed by physician to monitor blood sugars 1 kit 0   Continuous Glucose Sensor (FREESTYLE LIBRE 3 PLUS SENSOR) MISC 1 each by Does not apply route see administration instructions. Replace every 15 days. 6 each 3   gabapentin (NEURONTIN) 100 mg capsule TAKE 1  CAPSULE(100 MG) BY MOUTH THREE TIMES DAILY 90 capsule 1   glucose blood (ACCU-CHEK GUIDE) test strip one strip (1 each dose) by Other route 3 (three) times a day. 100 each 5   HYDROcodone -acetaminophen  (NORCO) 5-325 mg per tablet Take one tablet by mouth every 6 (six) hours as needed.       ibuprofen  (ADVIL ,MOTRIN ) 800 mg tablet Take one tablet (800 mg dose) by mouth 2 (two) times a day as needed for Pain for up to 30 days. 60 tablet 1   Insulin Glargine (TOUJEO SOLOSTAR) 300 UNIT/ML SOPN Inject sixteen Units into the skin daily. 9 mL 3   Insulin Pen Needle (BD,SURE COMFORT,NOVOFINE) 32G X 4 MM MISC one each by Does not apply route daily. 100 each 3   lidocaine  (LIDODERM ) 5% Place one patch onto the skin daily.       metformin (GLUCOPHAGE) 1000 MG tablet Take one tablet (1,000 mg dose) by mouth 2 (two) times a day with meals. 180 tablet 3   Misc. Devices MISC Start CPAP at 7 cm. water pressure.  Prefer Resmed S11 CPAP machine with a mask and supplies for severe OSA with AHI 33.  Res Med N30 Nasal mask size Medium or patient preference.  Send to a local DME. 1 each 0   Multiple Vitamins-Minerals (MENS 50+ MULTI VITAMIN/MIN PO) Take by  mouth.       nortriptyline HCl (PAMELOR) 10 mg capsule TAKE 3 CAPSULES(30 MG) BY MOUTH AT BEDTIME 90 capsule 0   nortriptyline HCl (PAMELOR) 10 mg capsule Take three capsules (30 mg dose) by mouth at bedtime. 90 capsule 0   omeprazole (PRILOSEC) 20 mg capsule Take one capsule (20 mg dose) by mouth daily. For 2 weeks for acid reflux 30 capsule 0   rosuvastatin calcium (CRESTOR) 20 mg tablet TAKE 1 TABLET(20 MG) BY MOUTH DAILY 90 tablet 3   Semaglutide, 2 MG/DOSE, (OZEMPIC, 2 MG/DOSE,) 8 MG/3ML SOPN pen INJECT 2 MG INTO THE SKIN ONCE EVERY WEEK 9 mL 3   sildenafil citrate (VIAGRA) 100 mg tablet Take one tablet (100 mg dose) by mouth as needed for Erectile Dysfunction (take on empty stomach before the evening meal). 30 tablet 5   tadalafil (CIALIS) 20 MG tablet Take one tablet (20 mg dose) by mouth daily as needed for Erectile Dysfunction. 90 tablet 1   vitamin B-12 (CYANOCOBALAMIN) 1000 mcg tablet Take one tablet (1,000 mcg dose) by mouth daily.        No current facility-administered medications for this visit.        FAMILY HISTORY: [Family History]  [Family History]            Problem Relation Name Age of Onset   Cancer Sister       Diabetes Brother jamal     Heart attack Brother          Father       Heart disease Brother jamal     Hypertension Brother jamal     Kidney disease Brother jamal     No Known Problems Mother       Stroke Neg Hx          SOCIAL HISTORY: [Social History]  [Social History]          Socioeconomic History   Marital status: Married  Tobacco Use   Smoking status: Every Day      Current packs/day: 1.00      Average packs/day: 1 pack/day for 40.0 years (40.0 ttl pk-yrs)  Types: Cigarettes      Passive exposure: Past   Smokeless tobacco: Former   Tobacco comments:      1 or 2  a day  Vaping Use   Vaping status: Never Used  Substance and Sexual Activity   Alcohol use: Never   Drug use: Never   Sexual activity: Yes      Partners: Female       Birth control/protection: None      REVIEW OF SYSTEMS:The patient denies edema, cough, near syncope or syncope, nausea, vomiting, diarrhea, fever, chills, or bleeding. All other systems are reviewed and are negative except for that mentioned in the history of present illness.     PHYSICAL EXAM:   Vital Signs: BP 116/62 (BP Location: Left Upper Arm, Patient Position: Sitting)   Pulse 89   Ht 5' 7 (1.702 m)   Wt 169 lb 4.8 oz (76.8 kg)   SpO2 98%   BMI 26.52 kg/m  Constitutional:  Well nourished, well developed patient in no apparent distress. HENT:normocephalic,atraumatic, oropharynx moist,no oral exudates,nose normal. Neck: Supple. No JVD. Carotid bruits: Absent. Eyes: Conjunctiva normal, no discharge. Lymphatic:no lymphadenopathy noted. Cardiovascular: RRR. S1, S2. 1/6 systolic murmur at the left sternal border. No gallop. No rub. Respiratory: Mildly decreased breath sounds to auscultation bilaterally.  No wheezing, no rales. GI: Soft, nontender, nondistended, normal bowel sounds, no masses or hepatosplenomegaly.   Skin: Warm, dry, no erythema, no rash. Musculoskeletal: No edema, no tenderness, no cyanosis, no clubbing. Pulses: 1+ and intact bilaterally. Neurological:  Neuro exam is grossly normal with no focal deficits noted. Psychiatric: Patient is alert and oriented to person, place and time, normal affect.   No results found.   Recent Results             Recent Results (from the past 24 hours)  ECG 12 lead    Collection Time: 01/10/24  9:11 AM  Result Value Ref Range    Acquisition Device [id number]      Ventricular Rate 81 BPM    Atrial Rate 81 BPM    P-R Interval 174 ms    QRS Duration 82 ms    Q-T Interval 342 ms    QTC Calculation(Bazett) 397 ms    Calculated P Axis 72 degrees    Calculated R Axis 44 degrees    Calculated T Axis 90 degrees    ECG Diagnosis          Normal sinus rhythm , HR 81 Normal ECG When compared with ECG of 25-May-2020 09:27, No significant  change was found Fredrica Lipoma ([id number]) on 01/10/2024 9:17:22 AM certifies that he/she has reviewed the ECG tracing and confirms the independent interpretation is correct.                 Lab Results  Component Value Date    Cholesterol, Total 146 05/09/2023             Lab Results  Component Value Date    HDL 36 (L) 05/09/2023    No components found for: [facility]          Lab Results  Component Value Date    Triglycerides 203 (H) 05/09/2023    No results found for: [facility]          Lab Results  Component Value Date    Creatinine 0.83 01/01/2024    BUN 20 01/01/2024    Sodium 137 01/01/2024    Potassium 4.6 01/01/2024    Chloride 100 01/01/2024  CO2 21 01/01/2024    No results found for: CKMB, TROPONINI          Lab Results  Component Value Date    Hemoglobin 13.3 01/01/2024             Lab Results  Component Value Date    INR 0.9 04/04/2021          ASSESSMENT: [Problem List]   [Problem List] Patient Active Problem List      Diagnosis   Hyperlipidemia   Restless legs   Pain   Erectile dysfunction   Thyroid nodule   OSA (obstructive sleep apnea)   Cervical spondylosis with myelopathy   Hyperlipidemia associated with type 2 diabetes mellitus (*)   Multiple thyroid nodules   Spondylosis without myelopathy or radiculopathy, lumbar region   Tobacco abuse   Cervical radiculopathy   Arthralgia   Peripheral polyneuropathy   Metatarsalgia of both feet   Sesamoiditis of left foot   Nocturia   History of fall   Thoracic aortic aneurysm   Vitamin D deficiency   Paresthesia of foot, bilateral   Type 2 diabetes mellitus with diabetic neuropathy, without long-term current use of insulin (*)   Lumbar radiculopathy   History of colon polyps      Adrian Bernard is a 68 y.o. male with past medical history as above presented for evaluation for preop evaluation prior to placement of spinal stimulation with known risk factors with moderate  coronary artery calcifications and thoracic aortic aneurysm.   Preop evaluation prior to placement of spinal stimulator planned for February 05, 2024 - patient denies chest pain, shortness of breath, palpitations.   He has very limited mobility due to bilateral metatarsalgia feet and he can walk only 100 to 200 yards.   Patient has risk factors for CAD-IDDM type II, smoking, age, hyperlipidemia. Patient has known history of moderate coronary artery calcifications. I will get cardiac echo to reevaluate heart function and structure.  I will get coronary CTA to evaluate for CAD. If above workup is unremarkable patient could proceed with spinal stimulator placement on February 05, 2024-considering comorbidities patient has some risk of perioperative ischemic events but if above workup is unremarkable the risk would not be high.   I would avoid fluctuation of the blood pressure or heart rate during surgery.   I would keep potassium between 4.0 and 5.0 and magnesium  between 2.0 and 2.5.     Moderate coronary artery calcifications-patient denies chest pain, dyspnea, palpitations, near-syncope or syncope. BP and HR are under control. Bilateral feet metatarsalgia is limiting his mobility significantly.  If noted bilateral feet pain as per patient he could walk half a mile. I will get cardiac echo to reevaluate heart function and structure.  I will get coronary CTA to evaluate for CAD. Patient takes aspirin  81 mg and rosuvastatin 20 mg.   Small thoracic aortic aneurysm -ascending aorta apparently measured 4.3 cm by previous CT from September, 2022. From October 2023 chest CT revealed ascending aortic aneurysm 4.0 cm. From August 2025 stable thoracic aortic aneurysm measuring 4.4 cm noted by CT as above. BP is under control.  Heart rate is under control today but patient does not measure her heart rate at home.  I strongly encouraged patient to start recording BP and HR and let me know.    Hyperlipidemia-patient is on rosuvastatin 20mg  per PCPs suggestion.  The goal of LDL cholesterol less than 899, HDL cholesterol more than 45, Triglycerides less than 150.  Recent lipid panel from April 2025 was mildly abnormal with LDL at goal 76 but HDL slightly low 36 and TG slightly elevated 203 as above.  See overweight below. Further management of HLP as per PCP.   Smoking - Patient smokes 1 pack of cigarettes a day. I strongly encouraged patient to quit smoking. I suggested to consider nicotine patches.  Patient is going to consider nicotine patches if he can afford them.  Smoking cessation counseling-3 minutes.   OSA - Patient cannot say if he snores or not. Patient has obstructive sleep apnea diagnosed several years ago. Patient tried CPAP at night but could not tolerate it and does not use it. Patient does not follow with sleep physician. I strongly encouraged patient to discuss with sleep physician further management of sleep apnea and if he cannot tolerate CPAP consider mouthguard device for inspire device.  Patient is interested.  Will give referral to sleep clinic. See overweight below.   Overweight -Patient has gained 30-40 pounds over the last year and weight is now 169 pounds with BMI 26.5. Prior the patient lost 30-40 pounds over the year.  I strongly encouraged patient to stay on low salt, Mediterranean diet, walk daily some and lose weight.   Family history of CAD but not early CAD as above- noted.     PLAN:   Follow up lipid panel and liver function tests as per your PCP in 2-3 months.   Please, Quit smoking as soon as you can. Consider nicotine patches.   Continue other current medications.   Cardiac echo.   Coronary CTA.   Sleep physician evaluation.   Keep checking blood pressure and heart rate daily and record it and let us  or primary care physician know if any abnormal readings noted. Please check blood pressure and heart rate after sitting and relaxing  for 5 minutes or more. The goal of systolic blood pressure 100-130, diastolic BP 60-85, HR 60-80.   Please stay on low salt, Mediterranean diet, walk daily and avoid weight gain. Please see the list of Mediterranean diet below.    Drink plenty of water - about 60-64 ounces or more a day - to avoid dehydration and volume depletion.   Please avoid caffeine.   Avoid any strenuous activities.   Please avoid alcohol.   If you develop significant chest pain, shortness of breath, palpitations, near-syncope or syncope or lightheadedness, please call 911 and be transferred to emergency room for evaluation.   Follow up in 3 months or earlier if needed.   Care plan and follow-up as discussed or as needed if any worsening symptoms or change in condition. Pt expressed understanding. No barriers to meeting goals. After visit summary was given to the patient.   ADDENDUM:   Coronary CT revealed severe proximal LAD stenosis 70 to 99% stenosis and the study was sent for FFR.  Otherwise mild nonobstructive CAD. We contacted the patient and suggested cardiac cath for definite evaluation for CAD and possible intervention.  Patient would like to proceed with cardiac cath on Friday, January 25, 2024. Will schedule patient with Dr. Lilian on January 25, 2024.     Adrian Juba, MD, PhD          Electronically signed by Adrian Juba, MD at 01/22/2024 10:54 AM    Hospital Encounter on 01/25/2024 Note shared with patient in the original system   Received From: [facility]

## 2024-02-01 ENCOUNTER — Encounter

## 2024-02-05 ENCOUNTER — Encounter: Payer: Self-pay | Admitting: Neurosurgery

## 2024-02-05 ENCOUNTER — Other Ambulatory Visit: Payer: Self-pay

## 2024-02-05 ENCOUNTER — Ambulatory Visit
Admission: RE | Admit: 2024-02-05 | Discharge: 2024-02-05 | Disposition: A | Discharge status: Home / Self Care | Attending: Neurosurgery | Admitting: Neurosurgery

## 2024-02-05 ENCOUNTER — Encounter
Admission: RE | Disposition: A | Discharge status: Home / Self Care | Payer: Self-pay | Source: Home / Self Care | Attending: Neurosurgery

## 2024-02-05 ENCOUNTER — Telehealth: Payer: Self-pay

## 2024-02-05 DIAGNOSIS — G8929 Other chronic pain: Secondary | ICD-10-CM

## 2024-02-05 DIAGNOSIS — Z539 Procedure and treatment not carried out, unspecified reason: Secondary | ICD-10-CM | POA: Insufficient documentation

## 2024-02-05 DIAGNOSIS — G894 Chronic pain syndrome: Secondary | ICD-10-CM | POA: Insufficient documentation

## 2024-02-05 DIAGNOSIS — M961 Postlaminectomy syndrome, not elsewhere classified: Secondary | ICD-10-CM

## 2024-02-05 DIAGNOSIS — M5412 Radiculopathy, cervical region: Secondary | ICD-10-CM | POA: Insufficient documentation

## 2024-02-05 DIAGNOSIS — Z01818 Encounter for other preprocedural examination: Secondary | ICD-10-CM

## 2024-02-05 LAB — GLUCOSE, CAPILLARY: Glucose-Capillary: 151 mg/dL — ABNORMAL HIGH (ref 70–99)

## 2024-02-05 SURGERY — INSERTION, SPINAL CORD STIMULATOR, CERVICAL
Anesthesia: General

## 2024-02-05 MED ORDER — ROCURONIUM BROMIDE 10 MG/ML (PF) SYRINGE
PREFILLED_SYRINGE | INTRAVENOUS | Status: AC
  Filled 2024-02-05: qty 10

## 2024-02-05 MED ORDER — CEFAZOLIN SODIUM-DEXTROSE 2-4 GM/100ML-% IV SOLN
INTRAVENOUS | Status: AC
  Filled 2024-02-05: qty 100

## 2024-02-05 MED ORDER — ONDANSETRON HCL 4 MG/2ML IJ SOLN
INTRAMUSCULAR | Status: AC
  Filled 2024-02-05: qty 2

## 2024-02-05 MED ORDER — CHLORHEXIDINE GLUCONATE 0.12 % MT SOLN
OROMUCOSAL | Status: AC
  Filled 2024-02-05: qty 15

## 2024-02-05 MED ORDER — FENTANYL CITRATE (PF) 100 MCG/2ML IJ SOLN
INTRAMUSCULAR | Status: AC
  Filled 2024-02-05: qty 2

## 2024-02-05 MED ORDER — VANCOMYCIN HCL IN DEXTROSE 1-5 GM/200ML-% IV SOLN
INTRAVENOUS | Status: AC
  Filled 2024-02-05: qty 200

## 2024-02-05 MED ORDER — CEFAZOLIN SODIUM-DEXTROSE 2-4 GM/100ML-% IV SOLN: 2.0000 g | Freq: Once | INTRAVENOUS | Status: DC

## 2024-02-05 MED ORDER — PROPOFOL 10 MG/ML IV BOLUS
INTRAVENOUS | Status: AC
  Filled 2024-02-05: qty 20

## 2024-02-05 MED ORDER — LIDOCAINE HCL (PF) 2 % IJ SOLN
INTRAMUSCULAR | Status: AC
  Filled 2024-02-05: qty 5

## 2024-02-05 MED ORDER — MIDAZOLAM HCL 2 MG/2ML IJ SOLN
INTRAMUSCULAR | Status: AC
  Filled 2024-02-05: qty 2

## 2024-02-05 MED ORDER — VANCOMYCIN HCL IN DEXTROSE 1-5 GM/200ML-% IV SOLN: 1000.0000 mg | Freq: Once | INTRAVENOUS | Status: DC

## 2024-02-05 NOTE — H&P (Signed)
 "   Referring Physician:  No referring provider defined for this encounter.  Primary Physician:  Rena Luke POUR, MD  Discussed the use of AI scribe software for clinical note transcription with the patient, who gave verbal consent to proceed.  History of Present Illness Adrian Bernard is a 69 year old male with a history of neck surgery/ACDF who presents for evaluation of left arm pain and consideration of a permanent spinal cord stimulator. He has significant pain radiating down his left arm with intermittent numbness, worse at night and disrupting sleep. A prior temporary spinal cord stimulator trial gave substantial relief, greater than 90% He smokes. He also has foot pain similar to plantar fasciitis, mainly in the morning or after prolonged sitting, for which he plans podiatry evaluation.   SCS trial was done on 11/08/2023,  Psych Evaluation report in Media on 09/10/2023  Bowel/Bladder Dysfunction: none  Conservative measures:  Multimodal medical therapy including regular antiinflammatories:  Gabapentin, Ibuprofen , Lidocaine  patch, Oxycodone  Injections:10/26/2022 C4/C5, C5/C6 Medial Branch Block 05/25/2022  C7-T1 CESI 11/10/2021 Bilateral C4-C6 facet joint injection  Past Surgery: 06/03/2020 C3-5 Cervical Fusion  The symptoms are causing a significant impact on the patient's life.   I have utilized the care everywhere function in epic to review the outside records available from external health systems.  Review of Systems:  A 10 point review of systems is negative, except for the pertinent positives and negatives detailed in the HPI.  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    Thoracic aortic aneurysm    Type 2 Diabetes mellitus without complication (HCC)    Vitamin D deficiency    Wears denture upper is implants hard to remove per pt    Wears partial denture lower     Past Surgical History: Past Surgical History:  Procedure Laterality Date   cervical spinal anterior discectomy with fusion C 3 to C 5  06/03/2020   done @ [facility]   dental implants     2020    Allergies: Allergies as of 12/31/2023 - Review Complete 12/31/2023  Allergen Reaction Noted   Ciprofloxacin  Other (See Comments) 12/19/2021   Levaquin [levofloxacin] Other (See Comments) 12/19/2021    Medications:  Current Facility-Administered Medications:    ceFAZolin  (ANCEF ) IVPB 2g/100 mL premix, 2 g, Intravenous, Once, Claudene Penne ORN, MD   vancomycin  (VANCOCIN ) IVPB 1000 mg/200 mL premix, 1,000 mg, Intravenous, Once, Claudene Penne ORN, MD  Social History: Social History   Tobacco Use   Smoking status: Every Day    Current packs/day: 1.00    Average packs/day: 1 pack/day for 40.0 years (40.0 ttl pk-yrs)    Types: Cigarettes   Smokeless tobacco: Never  Vaping Use   Vaping status: Never Used  Substance Use Topics   Alcohol use: Never   Drug use: Never    Family Medical History: Family History  Problem Relation Age of Onset   Heart disease Father    Diabetes Brother  Heart disease Brother    Diabetes Brother    Heart disease Brother    Colon cancer Neg Hx     Physical Examination: Vitals:   02/05/24 0745  BP: 129/71  Pulse: 82  Resp: 16  Temp: (!) 97 F (36.1 C)  SpO2: 97%    General: Patient is in no apparent distress. Attention to examination is appropriate.  Neck:   Supple.  Full range of motion.  Respiratory: Patient is breathing without any difficulty.   NEUROLOGICAL:     Awake, alert, oriented to person,  place, and time.  Speech is clear and fluent.   Cranial Nerves: Pupils equal round and reactive to light.  Facial tone is symmetric.  Facial sensation is symmetric. Shoulder shrug is symmetric. Tongue protrusion is midline.    Strength: Side Biceps Triceps Deltoid Interossei Grip Wrist Ext. Wrist Flex.  R 5 5 5 5 5 5 5   L 5 5 5 5 5 5 5    Imaging: Narrative & Impression  CLINICAL DATA:  Neck trauma.  Pain since yesterday.   EXAM: CT CERVICAL SPINE WITHOUT CONTRAST   TECHNIQUE: Multidetector CT imaging of the cervical spine was performed without intravenous contrast. Multiplanar CT image reconstructions were also generated.   RADIATION DOSE REDUCTION: This exam was performed according to the departmental dose-optimization program which includes automated exposure control, adjustment of the mA and/or kV according to patient size and/or use of iterative reconstruction technique.   COMPARISON:  CT cervical spine 09/23/2022 and 11/26/2021.   FINDINGS: Alignment: Stable alignment with minimal anterolisthesis at C3-4.   Skull base and vertebrae: No evidence of acute fracture or traumatic subluxation. Postsurgical changes from previous C3-4 and C4-5 ACDF. No evidence of hardware displacement.   Soft tissues and spinal canal: No prevertebral fluid or swelling. No visible canal hematoma.   Disc levels: Similar multilevel spondylosis with uncinate spurring and facet hypertrophy. Grossly stable small central disc protrusion at C2-3. Grossly stable mild-to-moderate foraminal narrowing at multiple levels, greatest on the right at C6-7.   Upper chest: Grossly stable scarring at both lung apices.   Other: None.   IMPRESSION: 1. No evidence of acute cervical spine fracture, traumatic subluxation or static signs of instability. 2. Stable postsurgical changes from previous C3-4 and C4-5 ACDF. 3. Stable multilevel spondylosis.     Electronically Signed   By: Elsie Perone M.D.    On: 08/22/2023 16:42      I have personally reviewed the images and agree with the above interpretation.  Assessment and Plan Assessment & Plan Chronic left arm pain due to cervical radiculopathy, failed cervical spine surgical syndrome, chronic radicular pain Chronic left arm pain primarily due to cervical radiculopathy, with significant relief from a temporary spinal cord stimulator 90% improvement in pain and function. The pain is exacerbated during sleep and is associated with numbness but not significant weakness. The temporary stimulator provided 90% relief, indicating a positive response to the treatment. The permanent spinal cord stimulator is planned to provide similar relief, focusing on pain management rather than addressing weakness. Risks of the procedure include cerebrospinal fluid leak, nerve pain, and spinal injury. Smoking increases the risk of infection, potentially necessitating removal of the stimulator. The procedure involves an incision on the neck and back, with the stimulator battery placed on the right side to avoid using the left arm. The stimulator is expected to provide pain relief, allowing for improved function, but will not address neuropathic weakness. - Will schedule permanent spinal cord stimulator  placement. - Advised smoking cessation to reduce infection risk. - Instructed on post-operative activity restrictions: limit to 10 pounds for 6 weeks, then gradually increase. - Will coordinate with the team for scheduling and procedure details.  Penne MICAEL Sharps MD/MSCR Neurosurgery   "

## 2024-02-05 NOTE — H&P (View-Only) (Signed)
 "   Referring Physician:  No referring provider defined for this encounter.  Primary Physician:  Rena Luke POUR, MD  Discussed the use of AI scribe software for clinical note transcription with the patient, who gave verbal consent to proceed.  History of Present Illness Adrian Bernard is a 69 year old male with a history of neck surgery/ACDF who presents for evaluation of left arm pain and consideration of a permanent spinal cord stimulator. He has significant pain radiating down his left arm with intermittent numbness, worse at night and disrupting sleep. A prior temporary spinal cord stimulator trial gave substantial relief, greater than 90% He smokes. He also has foot pain similar to plantar fasciitis, mainly in the morning or after prolonged sitting, for which he plans podiatry evaluation.   SCS trial was done on 11/08/2023,  Psych Evaluation report in Media on 09/10/2023  Bowel/Bladder Dysfunction: none  Conservative measures:  Multimodal medical therapy including regular antiinflammatories:  Gabapentin, Ibuprofen , Lidocaine  patch, Oxycodone  Injections:10/26/2022 C4/C5, C5/C6 Medial Branch Block 05/25/2022  C7-T1 CESI 11/10/2021 Bilateral C4-C6 facet joint injection  Past Surgery: 06/03/2020 C3-5 Cervical Fusion  The symptoms are causing a significant impact on the patient's life.   I have utilized the care everywhere function in epic to review the outside records available from external health systems.  Review of Systems:  A 10 point review of systems is negative, except for the pertinent positives and negatives detailed in the HPI.  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    Thoracic aortic aneurysm    Type 2 Diabetes mellitus without complication (HCC)    Vitamin D deficiency    Wears denture upper is implants hard to remove per pt    Wears partial denture lower     Past Surgical History: Past Surgical History:  Procedure Laterality Date   cervical spinal anterior discectomy with fusion C 3 to C 5  06/03/2020   done @ [facility]   dental implants     2020    Allergies: Allergies as of 12/31/2023 - Review Complete 12/31/2023  Allergen Reaction Noted   Ciprofloxacin  Other (See Comments) 12/19/2021   Levaquin [levofloxacin] Other (See Comments) 12/19/2021    Medications:  Current Facility-Administered Medications:    ceFAZolin  (ANCEF ) IVPB 2g/100 mL premix, 2 g, Intravenous, Once, Claudene Penne ORN, MD   vancomycin  (VANCOCIN ) IVPB 1000 mg/200 mL premix, 1,000 mg, Intravenous, Once, Claudene Penne ORN, MD  Social History: Social History   Tobacco Use   Smoking status: Every Day    Current packs/day: 1.00    Average packs/day: 1 pack/day for 40.0 years (40.0 ttl pk-yrs)    Types: Cigarettes   Smokeless tobacco: Never  Vaping Use   Vaping status: Never Used  Substance Use Topics   Alcohol use: Never   Drug use: Never    Family Medical History: Family History  Problem Relation Age of Onset   Heart disease Father    Diabetes Brother  Heart disease Brother    Diabetes Brother    Heart disease Brother    Colon cancer Neg Hx     Physical Examination: Vitals:   02/05/24 0745  BP: 129/71  Pulse: 82  Resp: 16  Temp: (!) 97 F (36.1 C)  SpO2: 97%    General: Patient is in no apparent distress. Attention to examination is appropriate.  Neck:   Supple.  Full range of motion.  Respiratory: Patient is breathing without any difficulty.   NEUROLOGICAL:     Awake, alert, oriented to person,  place, and time.  Speech is clear and fluent.   Cranial Nerves: Pupils equal round and reactive to light.  Facial tone is symmetric.  Facial sensation is symmetric. Shoulder shrug is symmetric. Tongue protrusion is midline.    Strength: Side Biceps Triceps Deltoid Interossei Grip Wrist Ext. Wrist Flex.  R 5 5 5 5 5 5 5   L 5 5 5 5 5 5 5    Imaging: Narrative & Impression  CLINICAL DATA:  Neck trauma.  Pain since yesterday.   EXAM: CT CERVICAL SPINE WITHOUT CONTRAST   TECHNIQUE: Multidetector CT imaging of the cervical spine was performed without intravenous contrast. Multiplanar CT image reconstructions were also generated.   RADIATION DOSE REDUCTION: This exam was performed according to the departmental dose-optimization program which includes automated exposure control, adjustment of the mA and/or kV according to patient size and/or use of iterative reconstruction technique.   COMPARISON:  CT cervical spine 09/23/2022 and 11/26/2021.   FINDINGS: Alignment: Stable alignment with minimal anterolisthesis at C3-4.   Skull base and vertebrae: No evidence of acute fracture or traumatic subluxation. Postsurgical changes from previous C3-4 and C4-5 ACDF. No evidence of hardware displacement.   Soft tissues and spinal canal: No prevertebral fluid or swelling. No visible canal hematoma.   Disc levels: Similar multilevel spondylosis with uncinate spurring and facet hypertrophy. Grossly stable small central disc protrusion at C2-3. Grossly stable mild-to-moderate foraminal narrowing at multiple levels, greatest on the right at C6-7.   Upper chest: Grossly stable scarring at both lung apices.   Other: None.   IMPRESSION: 1. No evidence of acute cervical spine fracture, traumatic subluxation or static signs of instability. 2. Stable postsurgical changes from previous C3-4 and C4-5 ACDF. 3. Stable multilevel spondylosis.     Electronically Signed   By: Elsie Perone M.D.    On: 08/22/2023 16:42      I have personally reviewed the images and agree with the above interpretation.  Assessment and Plan Assessment & Plan Chronic left arm pain due to cervical radiculopathy, failed cervical spine surgical syndrome, chronic radicular pain Chronic left arm pain primarily due to cervical radiculopathy, with significant relief from a temporary spinal cord stimulator 90% improvement in pain and function. The pain is exacerbated during sleep and is associated with numbness but not significant weakness. The temporary stimulator provided 90% relief, indicating a positive response to the treatment. The permanent spinal cord stimulator is planned to provide similar relief, focusing on pain management rather than addressing weakness. Risks of the procedure include cerebrospinal fluid leak, nerve pain, and spinal injury. Smoking increases the risk of infection, potentially necessitating removal of the stimulator. The procedure involves an incision on the neck and back, with the stimulator battery placed on the right side to avoid using the left arm. The stimulator is expected to provide pain relief, allowing for improved function, but will not address neuropathic weakness. - Will schedule permanent spinal cord stimulator  placement. - Advised smoking cessation to reduce infection risk. - Instructed on post-operative activity restrictions: limit to 10 pounds for 6 weeks, then gradually increase. - Will coordinate with the team for scheduling and procedure details.  Penne MICAEL Sharps MD/MSCR Neurosurgery   "

## 2024-02-05 NOTE — Telephone Encounter (Signed)
 Called patient to reschedule surgery. Surgery date originally for 02/05/24. Patient was unable to proceed with surgery due to not remaining NPO for surgery. Surgery now scheduled for 02/12/24. Post op appointments changed. Reps notified. IOM updated. OR board updated. Prior auth still applicable.

## 2024-02-05 NOTE — Progress Notes (Signed)
 Patient ate a grape this morning at 0600. Dr. Dario notified. He stated that surgery would be delayed for 6 hours. Dr. Claudene noted that because of the delay that he would have to reschedule to another day. Patient notified. Patient put his clothes back on and all belongings returned. Patient left with son.

## 2024-02-05 NOTE — Interval H&P Note (Signed)
 History and Physical Interval Note:  02/05/2024 7:53 AM  [name]  has presented today for surgery, with the diagnosis of Failed spine surgical syndrome, cervical, chronic radicular pain, chronic pain syndrome.  The various methods of treatment have been discussed with the patient and family. After consideration of risks, benefits and other options for treatment, the patient has consented to  Procedures with comments: INSERTION, SPINAL CORD STIMULATOR, CERVICAL (N/A) - INSERTION, SPINAL CORD STIMULATOR, CERVICAL as a surgical intervention.  The patient's history has been reviewed, patient examined, no change in status, stable for surgery.  I have reviewed the patient's chart and labs.  Questions were answered to the patient's satisfaction.    Heart and lungs clear   Adrian Bernard

## 2024-02-12 ENCOUNTER — Other Ambulatory Visit: Payer: Self-pay

## 2024-02-12 ENCOUNTER — Ambulatory Visit

## 2024-02-12 ENCOUNTER — Ambulatory Visit
Admission: RE | Admit: 2024-02-12 | Discharge: 2024-02-12 | Disposition: A | Discharge status: Home / Self Care | Attending: Neurosurgery | Admitting: Neurosurgery

## 2024-02-12 ENCOUNTER — Encounter
Admission: RE | Disposition: A | Discharge status: Home / Self Care | Payer: Self-pay | Source: Home / Self Care | Attending: Neurosurgery

## 2024-02-12 ENCOUNTER — Encounter: Payer: Self-pay | Admitting: Neurosurgery

## 2024-02-12 DIAGNOSIS — M722 Plantar fascial fibromatosis: Secondary | ICD-10-CM | POA: Insufficient documentation

## 2024-02-12 DIAGNOSIS — G894 Chronic pain syndrome: Secondary | ICD-10-CM | POA: Diagnosis not present

## 2024-02-12 DIAGNOSIS — M4722 Other spondylosis with radiculopathy, cervical region: Secondary | ICD-10-CM | POA: Insufficient documentation

## 2024-02-12 DIAGNOSIS — I2510 Atherosclerotic heart disease of native coronary artery without angina pectoris: Secondary | ICD-10-CM | POA: Diagnosis not present

## 2024-02-12 DIAGNOSIS — E119 Type 2 diabetes mellitus without complications: Secondary | ICD-10-CM | POA: Insufficient documentation

## 2024-02-12 DIAGNOSIS — G709 Myoneural disorder, unspecified: Secondary | ICD-10-CM | POA: Insufficient documentation

## 2024-02-12 DIAGNOSIS — M5412 Radiculopathy, cervical region: Secondary | ICD-10-CM

## 2024-02-12 DIAGNOSIS — Z419 Encounter for procedure for purposes other than remedying health state, unspecified: Secondary | ICD-10-CM

## 2024-02-12 DIAGNOSIS — Z01818 Encounter for other preprocedural examination: Secondary | ICD-10-CM

## 2024-02-12 DIAGNOSIS — M961 Postlaminectomy syndrome, not elsewhere classified: Secondary | ICD-10-CM | POA: Diagnosis not present

## 2024-02-12 DIAGNOSIS — F17210 Nicotine dependence, cigarettes, uncomplicated: Secondary | ICD-10-CM | POA: Insufficient documentation

## 2024-02-12 DIAGNOSIS — G4733 Obstructive sleep apnea (adult) (pediatric): Secondary | ICD-10-CM | POA: Insufficient documentation

## 2024-02-12 DIAGNOSIS — Z981 Arthrodesis status: Secondary | ICD-10-CM | POA: Insufficient documentation

## 2024-02-12 DIAGNOSIS — G8929 Other chronic pain: Secondary | ICD-10-CM | POA: Diagnosis present

## 2024-02-12 HISTORY — PX: SPINAL CORD STIMULATOR INSERTION: SHX5378

## 2024-02-12 LAB — GLUCOSE, CAPILLARY
Glucose-Capillary: 157 mg/dL — ABNORMAL HIGH (ref 70–99)
Glucose-Capillary: 226 mg/dL — ABNORMAL HIGH (ref 70–99)

## 2024-02-12 MED ORDER — FENTANYL CITRATE (PF) 100 MCG/2ML IJ SOLN
25.0000 ug | INTRAMUSCULAR | Status: DC | PRN
  Administered 2024-02-12 (×2): 50 ug via INTRAVENOUS

## 2024-02-12 MED ORDER — SENNA 8.6 MG PO TABS
1.0000 | ORAL_TABLET | Freq: Two times a day (BID) | ORAL | 0 refills | Status: AC | PRN
  Filled 2024-02-12: qty 30, 15d supply, fill #0

## 2024-02-12 MED ORDER — OXYCODONE HCL 5 MG PO TABS
5.0000 mg | ORAL_TABLET | Freq: Once | ORAL | Status: AC | PRN
  Administered 2024-02-12: 5 mg via ORAL

## 2024-02-12 MED ORDER — VANCOMYCIN HCL IN DEXTROSE 1-5 GM/200ML-% IV SOLN
INTRAVENOUS | Status: AC
  Filled 2024-02-12: qty 200

## 2024-02-12 MED ORDER — FENTANYL CITRATE (PF) 100 MCG/2ML IJ SOLN
INTRAMUSCULAR | Status: AC
  Filled 2024-02-12: qty 2

## 2024-02-12 MED ORDER — PROPOFOL 10 MG/ML IV BOLUS
INTRAVENOUS | Status: DC | PRN
  Administered 2024-02-12: 70 mg via INTRAVENOUS
  Administered 2024-02-12: 170 mg via INTRAVENOUS
  Administered 2024-02-12: 30 mg via INTRAVENOUS
  Administered 2024-02-12: 50 mg via INTRAVENOUS

## 2024-02-12 MED ORDER — MIDAZOLAM HCL (PF) 2 MG/2ML IJ SOLN
INTRAMUSCULAR | Status: DC | PRN
  Administered 2024-02-12: 2 mg via INTRAVENOUS

## 2024-02-12 MED ORDER — METHOCARBAMOL 500 MG PO TABS
500.0000 mg | ORAL_TABLET | Freq: Four times a day (QID) | ORAL | 0 refills | Status: AC | PRN
  Filled 2024-02-12: qty 120, 30d supply, fill #0

## 2024-02-12 MED ORDER — ONDANSETRON HCL 4 MG/2ML IJ SOLN
INTRAMUSCULAR | Status: DC | PRN
  Administered 2024-02-12: 4 mg via INTRAVENOUS

## 2024-02-12 MED ORDER — CEFAZOLIN SODIUM-DEXTROSE 2-4 GM/100ML-% IV SOLN
INTRAVENOUS | Status: AC
  Filled 2024-02-12: qty 100

## 2024-02-12 MED ORDER — OXYCODONE HCL 5 MG PO TABS
ORAL_TABLET | ORAL | Status: AC
  Filled 2024-02-12: qty 1

## 2024-02-12 MED ORDER — ACETAMINOPHEN 10 MG/ML IV SOLN
INTRAVENOUS | Status: DC | PRN
  Administered 2024-02-12: 1000 mg via INTRAVENOUS

## 2024-02-12 MED ORDER — CEFAZOLIN IN SODIUM CHLORIDE 2-0.9 GM/100ML-% IV SOLN
2.0000 g | Freq: Once | INTRAVENOUS | Status: AC
  Administered 2024-02-12: 2 g via INTRAVENOUS
  Filled 2024-02-12: qty 100

## 2024-02-12 MED ORDER — PHENYLEPHRINE HCL-NACL 20-0.9 MG/250ML-% IV SOLN
INTRAVENOUS | Status: DC | PRN
  Administered 2024-02-12: 10 ug/min via INTRAVENOUS

## 2024-02-12 MED ORDER — MIDAZOLAM HCL 2 MG/2ML IJ SOLN
INTRAMUSCULAR | Status: AC
  Filled 2024-02-12: qty 2

## 2024-02-12 MED ORDER — REMIFENTANIL HCL 1 MG IV SOLR
INTRAVENOUS | Status: DC | PRN
  Administered 2024-02-12: 50 ug via INTRAVENOUS
  Administered 2024-02-12: .05 ug/kg/min via INTRAVENOUS
  Administered 2024-02-12: 100 ug via INTRAVENOUS
  Administered 2024-02-12: 50 ug via INTRAVENOUS

## 2024-02-12 MED ORDER — ACETAMINOPHEN 10 MG/ML IV SOLN: 1000.0000 mg | Freq: Once | INTRAVENOUS | Status: DC | PRN

## 2024-02-12 MED ORDER — PROPOFOL 1000 MG/100ML IV EMUL
INTRAVENOUS | Status: AC
  Filled 2024-02-12: qty 100

## 2024-02-12 MED ORDER — CHLORHEXIDINE GLUCONATE 0.12 % MT SOLN
OROMUCOSAL | Status: AC
  Filled 2024-02-12: qty 15

## 2024-02-12 MED ORDER — OXYCODONE HCL 5 MG PO TABS
5.0000 mg | ORAL_TABLET | ORAL | 0 refills | Status: AC | PRN
  Filled 2024-02-12: qty 30, 5d supply, fill #0

## 2024-02-12 MED ORDER — LIDOCAINE HCL (PF) 2 % IJ SOLN
INTRAMUSCULAR | Status: AC
  Filled 2024-02-12: qty 5

## 2024-02-12 MED ORDER — DROPERIDOL 2.5 MG/ML IJ SOLN: 0.6250 mg | Freq: Once | INTRAMUSCULAR | Status: DC | PRN

## 2024-02-12 MED ORDER — PROPOFOL 10 MG/ML IV BOLUS
INTRAVENOUS | Status: AC
  Filled 2024-02-12: qty 20

## 2024-02-12 MED ORDER — 0.9 % SODIUM CHLORIDE (POUR BTL) OPTIME
TOPICAL | Status: DC | PRN
  Administered 2024-02-12: 500 mL

## 2024-02-12 MED ORDER — LACTATED RINGERS IV SOLN: INTRAVENOUS | Status: DC | PRN

## 2024-02-12 MED ORDER — PHENYLEPHRINE HCL-NACL 20-0.9 MG/250ML-% IV SOLN
INTRAVENOUS | Status: AC
  Filled 2024-02-12: qty 250

## 2024-02-12 MED ORDER — HYDROMORPHONE HCL 1 MG/ML IJ SOLN
INTRAMUSCULAR | Status: AC
  Filled 2024-02-12: qty 1

## 2024-02-12 MED ORDER — ORAL CARE MOUTH RINSE: 15.0000 mL | Freq: Once | OROMUCOSAL | Status: AC

## 2024-02-12 MED ORDER — BUPIVACAINE HCL (PF) 0.5 % IJ SOLN
INTRAMUSCULAR | Status: DC | PRN
  Administered 2024-02-12: 10 mL

## 2024-02-12 MED ORDER — OXYCODONE HCL 5 MG/5ML PO SOLN: 5.0000 mg | Freq: Once | ORAL | Status: AC | PRN

## 2024-02-12 MED ORDER — BUPIVACAINE-EPINEPHRINE (PF) 0.5% -1:200000 IJ SOLN
INTRAMUSCULAR | Status: DC | PRN
  Administered 2024-02-12: 10 mL

## 2024-02-12 MED ORDER — ARTIFICIAL TEARS OPHTHALMIC OINT
TOPICAL_OINTMENT | OPHTHALMIC | Status: AC
  Filled 2024-02-12: qty 3.5

## 2024-02-12 MED ORDER — PHENYLEPHRINE 80 MCG/ML (10ML) SYRINGE FOR IV PUSH (FOR BLOOD PRESSURE SUPPORT)
PREFILLED_SYRINGE | INTRAVENOUS | Status: AC
  Filled 2024-02-12: qty 10

## 2024-02-12 MED ORDER — BUPIVACAINE-EPINEPHRINE (PF) 0.5% -1:200000 IJ SOLN
INTRAMUSCULAR | Status: AC
  Filled 2024-02-12: qty 10

## 2024-02-12 MED ORDER — DEXAMETHASONE SOD PHOSPHATE PF 10 MG/ML IJ SOLN
INTRAMUSCULAR | Status: AC
  Filled 2024-02-12: qty 1

## 2024-02-12 MED ORDER — SUCCINYLCHOLINE CHLORIDE 200 MG/10ML IV SOSY
PREFILLED_SYRINGE | INTRAVENOUS | Status: AC
  Filled 2024-02-12: qty 10

## 2024-02-12 MED ORDER — ONDANSETRON HCL 4 MG/2ML IJ SOLN
INTRAMUSCULAR | Status: AC
  Filled 2024-02-12: qty 2

## 2024-02-12 MED ORDER — PHENYLEPHRINE 80 MCG/ML (10ML) SYRINGE FOR IV PUSH (FOR BLOOD PRESSURE SUPPORT)
PREFILLED_SYRINGE | INTRAVENOUS | Status: DC | PRN
  Administered 2024-02-12: 160 ug via INTRAVENOUS
  Administered 2024-02-12 (×3): 80 ug via INTRAVENOUS

## 2024-02-12 MED ORDER — IRRISEPT - 450ML BOTTLE WITH 0.05% CHG IN STERILE WATER, USP 99.95% OPTIME
TOPICAL | Status: DC | PRN
  Administered 2024-02-12: 450 mL via TOPICAL

## 2024-02-12 MED ORDER — VANCOMYCIN HCL IN DEXTROSE 1-5 GM/200ML-% IV SOLN
1000.0000 mg | Freq: Once | INTRAVENOUS | Status: AC
  Administered 2024-02-12: 1000 mg via INTRAVENOUS

## 2024-02-12 MED ORDER — CHLORHEXIDINE GLUCONATE 0.12 % MT SOLN
15.0000 mL | Freq: Once | OROMUCOSAL | Status: AC
  Administered 2024-02-12: 15 mL via OROMUCOSAL

## 2024-02-12 MED ORDER — KETOROLAC TROMETHAMINE 30 MG/ML IJ SOLN
INTRAMUSCULAR | Status: DC | PRN
  Administered 2024-02-12: 15 mg via INTRAVENOUS

## 2024-02-12 MED ORDER — EPHEDRINE 5 MG/ML INJ
INTRAVENOUS | Status: AC
  Filled 2024-02-12: qty 5

## 2024-02-12 MED ORDER — SODIUM CHLORIDE 0.9 % IV SOLN: INTRAVENOUS | Status: DC

## 2024-02-12 MED ORDER — SURGIFLO WITH THROMBIN (HEMOSTATIC MATRIX KIT) OPTIME
TOPICAL | Status: DC | PRN
  Administered 2024-02-12: 1 via TOPICAL

## 2024-02-12 MED ORDER — PROPOFOL 500 MG/50ML IV EMUL
INTRAVENOUS | Status: DC | PRN
  Administered 2024-02-12: 150 ug/kg/min via INTRAVENOUS

## 2024-02-12 MED ORDER — VASOPRESSIN 20 UNIT/ML IV SOLN
INTRAVENOUS | Status: DC | PRN
  Administered 2024-02-12 (×2): 2 [IU] via INTRAVENOUS

## 2024-02-12 MED ORDER — DEXAMETHASONE SOD PHOSPHATE PF 10 MG/ML IJ SOLN
INTRAMUSCULAR | Status: DC | PRN
  Administered 2024-02-12: 10 mg via INTRAVENOUS

## 2024-02-12 MED ORDER — SUCCINYLCHOLINE CHLORIDE 200 MG/10ML IV SOSY
PREFILLED_SYRINGE | INTRAVENOUS | Status: DC | PRN
  Administered 2024-02-12: 80 mg via INTRAVENOUS

## 2024-02-12 MED ORDER — EPHEDRINE SULFATE-NACL 50-0.9 MG/10ML-% IV SOSY
PREFILLED_SYRINGE | INTRAVENOUS | Status: DC | PRN
  Administered 2024-02-12: 10 mg via INTRAVENOUS
  Administered 2024-02-12: 5 mg via INTRAVENOUS

## 2024-02-12 MED ORDER — VASOPRESSIN 20 UNIT/ML IV SOLN
INTRAVENOUS | Status: AC
  Filled 2024-02-12: qty 1

## 2024-02-12 MED ORDER — HYDROMORPHONE HCL 1 MG/ML IJ SOLN
INTRAMUSCULAR | Status: DC | PRN
  Administered 2024-02-12: .25 mg via INTRAVENOUS

## 2024-02-12 MED ORDER — ACETAMINOPHEN 10 MG/ML IV SOLN
INTRAVENOUS | Status: AC
  Filled 2024-02-12: qty 100

## 2024-02-12 MED ORDER — FENTANYL CITRATE (PF) 100 MCG/2ML IJ SOLN
INTRAMUSCULAR | Status: DC | PRN
  Administered 2024-02-12: 50 ug via INTRAVENOUS

## 2024-02-12 MED ORDER — REMIFENTANIL HCL 1 MG IV SOLR
INTRAVENOUS | Status: AC
  Filled 2024-02-12: qty 1000

## 2024-02-12 MED ORDER — LIDOCAINE HCL (CARDIAC) PF 100 MG/5ML IV SOSY
PREFILLED_SYRINGE | INTRAVENOUS | Status: DC | PRN
  Administered 2024-02-12 (×2): 50 mg via INTRAVENOUS

## 2024-02-12 MED ORDER — BUPIVACAINE HCL (PF) 0.5 % IJ SOLN
INTRAMUSCULAR | Status: AC
  Filled 2024-02-12: qty 30

## 2024-02-12 MED ORDER — KETOROLAC TROMETHAMINE 30 MG/ML IJ SOLN
INTRAMUSCULAR | Status: AC
  Filled 2024-02-12: qty 1

## 2024-02-12 SURGICAL SUPPLY — 2 items
FEE INTRAOP CADWELL SUPPLY NCS (MISCELLANEOUS) IMPLANT
FEE INTRAOP MONITOR IMPULS NCS (MISCELLANEOUS) IMPLANT

## 2024-02-12 NOTE — Interval H&P Note (Signed)
 History and Physical Interval Note:  02/12/2024 9:09 AM  Adrian Bernard  has presented today for surgery, with the diagnosis of Failed spine surgical syndrome, cervical, chronic radicular pain, chronic pain syndrome.  The various methods of treatment have been discussed with the patient and family. After consideration of risks, benefits and other options for treatment, the patient has consented to  Procedures with comments: INSERTION, SPINAL CORD STIMULATOR, CERVICAL (N/A) - INSERTION, SPINAL CORD STIMULATOR, CERVICAL as a surgical intervention.  The patient's history has been reviewed, patient examined, no change in status, stable for surgery.  I have reviewed the patient's chart and labs.  Questions were answered to the patient's satisfaction.    Heart and lungs clear    Penne LELON Sharps

## 2024-02-12 NOTE — Anesthesia Preprocedure Evaluation (Addendum)
"                                    Anesthesia Evaluation  Patient identified by MRN, date of birth, ID band Patient awake    Reviewed: Allergy & Precautions, H&P , NPO status , Patient's Chart, lab work & pertinent test results  Airway Mallampati: II  TM Distance: >3 FB Neck ROM: full    Dental  (+) Implants   Pulmonary sleep apnea , Current Smoker and Patient abstained from smoking.   Pulmonary exam normal        Cardiovascular + CAD (mild non-obstructive)  Normal cardiovascular exam     Neuro/Psych  Neuromuscular disease  negative psych ROS   GI/Hepatic negative GI ROS, Neg liver ROS,,,  Endo/Other  diabetes, Well Controlled, Type 2    Renal/GU      Musculoskeletal  (+) Arthritis ,    Abdominal Normal abdominal exam  (+)   Peds  Hematology negative hematology ROS (+)   Anesthesia Other Findings Past Medical History: No date: Arthritis     Comment:  fingers of both hands No date: GERD (gastroesophageal reflux disease) No date: High cholesterol 12/29/2020: History of COVID-19     Comment:  mild symptoms all symptoms resolved No date: History of hypertension     Comment:  no meds x 1 year since weight loss per pt on 04-21-2021 No date: Lower back pain No date: OSA (obstructive sleep apnea) No date: Plantar fasciitis No date: Restless legs syndrome No date: 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 No date: Thoracic aortic aneurysm No date: Type 2 Diabetes mellitus without complication (HCC) No date: Vitamin D deficiency No date: Wears denture upper is implants hard to remove per pt No date: Wears partial denture lower  Past Surgical History: 06/03/2020: cervical spinal anterior discectomy with fusion C 3 to C 5     Comment:  done @ [facility] No date: dental implants     Comment:  2020  BMI    Body Mass Index: 26.63 kg/m      Reproductive/Obstetrics negative OB ROS                               Anesthesia Physical Anesthesia Plan  ASA: 2  Anesthesia Plan: General ETT   Post-op Pain Management: Toradol  IV (intra-op)* and Ofirmev  IV (intra-op)*   Induction: Intravenous  PONV Risk Score and Plan: 2 and Ondansetron , Dexamethasone  and Midazolam   Airway Management Planned: Oral ETT  Additional Equipment:   Intra-op Plan:   Post-operative Plan: Extubation in OR  Informed Consent: I have reviewed the patients History and Physical, chart, labs and discussed the procedure including the risks, benefits and alternatives for the proposed anesthesia with the patient or authorized representative who has indicated his/her understanding and acceptance.     Dental Advisory Given  Plan Discussed with: CRNA and Surgeon  Anesthesia Plan Comments:          Anesthesia Quick Evaluation  "

## 2024-02-12 NOTE — Anesthesia Procedure Notes (Signed)
 Procedure Name: Intubation Date/Time: 02/12/2024 9:53 AM  Performed by: Trudy Rankin LABOR, CRNAPre-anesthesia Checklist: Patient identified, Patient being monitored, Timeout performed, Emergency Drugs available and Suction available Patient Re-evaluated:Patient Re-evaluated prior to induction Oxygen  Delivery Method: Circle System Utilized Preoxygenation: Pre-oxygenation with 100% oxygen  Induction Type: IV induction and Rapid sequence Laryngoscope Size: Mac, 3 and McGrath Grade View: Grade I Tube type: Oral Tube size: 7.5 mm Number of attempts: 1 Airway Equipment and Method: Stylet and Video-laryngoscopy Placement Confirmation: ETT inserted through vocal cords under direct vision, positive ETCO2 and breath sounds checked- equal and bilateral Secured at: 22 cm Tube secured with: Tape Dental Injury: Teeth and Oropharynx as per pre-operative assessment

## 2024-02-12 NOTE — Discharge Instructions (Addendum)
 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 Increase protein intake to promote wound healing.  Recommended activity: no lifting, driving, or strenuous exercise for 2 weeks. You should walk multiple times per day  Special Instructions  No straining, no heavy lifting > 10lbs x 4 weeks.  Keep incision area clean and dry. May shower in 2 days. No baths or pools for 6 weeks.  Please remove dressing tomorrow, no need to apply a bandage afterwards  You have no sutures to remove, the skin is closed with adhesive  Please take pain medications as directed. Take a stool softener if on pain medications  *Regarding compression stockings-  Please wear day and night until you are walking a couple hundred feet three times a day.   Please Report any of the following: Nausea or Vomiting, Temperature is greater than 101.86F (38.1C) degrees, Dizziness, Abdominal Pain, Difficulty Breathing or Shortness of Breath, Inability to Eat, drink Fluids, or Take medications, Bleeding, swelling, or drainage from surgical incision sites, New numbness or weakness, and Bowel or bladder dysfunction to the neurosurgeon on call. How to contact us :  If you have any questions/concerns before or after surgery, you can reach us  at [phone], or you can send a mychart message. We can be reached by phone or mychart 8am-4pm, Monday-Friday.  *Please note: Calls after 4pm are forwarded to a third party answering service. Mychart messages are not routinely monitored during evenings, weekends, and holidays. Please call our office to contact the answering service for urgent concerns during non-business hours.   Additional Follow up appointments Please follow up with  Lyle Decamp PA-C as scheduled in 2-3 weeks   Please see below for scheduled appointments:  Future Appointments  Date Time Provider Department Center  02/26/2024  9:00 AM Decamp Lyle, PA-C [facility]  03/24/2024  9:45 AM Claudene Penne ORN, MD [facility]

## 2024-02-12 NOTE — Transfer of Care (Signed)
 Immediate Anesthesia Transfer of Care Note  Patient: Adrian Bernard  Procedure(s) Performed: INSERTION, SPINAL CORD STIMULATOR, CERVICAL (Spine Cervical)  Patient Location: PACU  Anesthesia Type:General  Level of Consciousness: awake and sedated  Airway & Oxygen  Therapy: Patient Spontanous Breathing and Patient connected to face mask oxygen   Post-op Assessment: Report given to RN and Post -op Vital signs reviewed and stable  Post vital signs: Reviewed and stable  Last Vitals:  Vitals Value Taken Time  BP 114/56 02/12/24 12:22  Temp    Pulse 58 02/12/24 12:25  Resp 15 02/12/24 12:25  SpO2 100 % 02/12/24 12:25  Vitals shown include unfiled device data.  Last Pain:  Vitals:   02/12/24 0822  TempSrc: Temporal  PainSc: 0-No pain         Complications: No notable events documented.

## 2024-02-13 ENCOUNTER — Encounter: Payer: Self-pay | Admitting: Neurosurgery

## 2024-02-14 NOTE — Anesthesia Postprocedure Evaluation (Signed)
"   Anesthesia Post Note  Patient: Adrian Bernard  Procedure(s) Performed: INSERTION, SPINAL CORD STIMULATOR, CERVICAL (Spine Cervical)  Patient location during evaluation: PACU Anesthesia Type: General Level of consciousness: awake and alert Pain management: pain level controlled Vital Signs Assessment: post-procedure vital signs reviewed and stable Respiratory status: spontaneous breathing, nonlabored ventilation, respiratory function stable and patient connected to nasal cannula oxygen  Cardiovascular status: blood pressure returned to baseline and stable Postop Assessment: no apparent nausea or vomiting Anesthetic complications: no   No notable events documented.   Last Vitals:  Vitals:   02/12/24 1355 02/12/24 1412  BP: 113/60 116/65  Pulse: 74 73  Resp: 19   Temp: (!) 36.1 C (!) 36.2 C  SpO2: 97% 100%    Last Pain:  Vitals:   02/12/24 1412  TempSrc:   PainSc: 0-No pain                 Prentice Murphy      "

## 2024-02-18 ENCOUNTER — Encounter: Admitting: Physician Assistant

## 2024-02-19 ENCOUNTER — Encounter: Admitting: Physician Assistant

## 2024-02-19 ENCOUNTER — Encounter: Payer: Self-pay | Admitting: Neurosurgery

## 2024-02-19 ENCOUNTER — Encounter: Admitting: Gastroenterology

## 2024-02-21 ENCOUNTER — Ambulatory Visit: Admitting: Physician Assistant

## 2024-02-21 VITALS — BP 110/60 | Temp 98.3°F

## 2024-02-21 DIAGNOSIS — Z09 Encounter for follow-up examination after completed treatment for conditions other than malignant neoplasm: Secondary | ICD-10-CM

## 2024-02-21 DIAGNOSIS — G894 Chronic pain syndrome: Secondary | ICD-10-CM

## 2024-02-21 MED ORDER — OXYCODONE HCL 5 MG PO TABS: 5.0000 mg | ORAL_TABLET | Freq: Three times a day (TID) | ORAL | 0 refills | Status: AC | PRN

## 2024-02-21 MED ORDER — SENNOSIDES-DOCUSATE SODIUM 8.6-50 MG PO TABS: 1.0000 | ORAL_TABLET | Freq: Every day | ORAL | 2 refills | Status: AC

## 2024-02-21 NOTE — Progress Notes (Signed)
" ° °  REFERRING PHYSICIAN:  Rena Luke POUR, Md [address] [place],  [place] [address]  DOS: 02/12/24, cervical spinal cord stimulator placement  HISTORY OF PRESENT ILLNESS: Howie I Hebb is 2 weeks status post cervical spinal cord stimulator placement. Overall, he is doing okay.  He is having quite a bit of pain on the back of his neck as expected.  He also went approximately 2 weeks without a bowel movement and was having a significant amount of abdominal discomfort, but has since had a BM.    PHYSICAL EXAMINATION:  NEUROLOGICAL:  General: In no acute distress.   Awake, alert, oriented to person, place, and time.  Pupils equal round and reactive to light.  Facial tone is symmetric.    Strength: Side Biceps Triceps Deltoid Interossei Grip Wrist Ext. Wrist Flex.  R 5 5 5 5 5 5 5   L 5 5 5 5 5 5 5    Incisions c/d/I  Imaging:  No interval imaging  Assessment / Plan: Ryshawn I Rock is doing okay after cervical spinal stimulator placement. We discussed activity escalation and I have advised the patient to lift up to 10 pounds until 6 weeks after surgery, then increase up to 25 pounds until 12 weeks after surgery.  After 12 weeks post-op, the patient advised to increase activity as tolerated. he will return to clinic in approximately 1 month.  Spinal cord stimulator rep was available today to discuss optimizing his stimulator.  Patient was also counseled on the importance of laxatives in the setting of narcotic usage to avoid constipation again.   Advised to contact the office if any questions or concerns arise.   Lyle Decamp PA-C Dept of Neurosurgery   "

## 2024-02-23 NOTE — Op Note (Signed)
 Indications: the patient is a 69yo male who was diagnosed with failed surgical spine syndrome, chronic pain syndrome, chronic radicular pain.  After undergoing a thorough evaluation with pain management and exhausting all other conservative options they were evaluated and planned for a spinal cord stimulator of the cervical spine   Findings: Successful placement of spinal cord stimulator cervical. 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     Preoperative Note:    The patient was seen in the preoperative area.  They continue to have severe symptoms of refractory pain that is not being managed by all prior conservative methods.  Risks of surgery discussed in clinic and reiterated during the preoperative meeting.  Patient elected go forward with the procedure.   Operative Note:      The patient was then brought from the preoperative center with intravenous access established.  The patient underwent general anesthesia and endotracheal tube intubation, then was rotated on the OR table in [place] pins and head holder.  A midline incision was planned just caudal to the inion and ended centered over the C2/3 spinous processes.  Intraoperative neuromonitoring leads were placed.  The patient was then prepped and draped in the usual fashion with all of the pressure points padded.  Comprehensive timeout verifying the patient's name, MRN, planned procedure was performed.  After the timeout local anesthetic was infiltrated at the planned incision site at the base of the skull as well as the planned  tunneling site on the  flank.   Skin and subcutaneous tissues were opened sharply.  Bovie cautery was used to dissect down to the midline fascia.  Midline fascia was opened in a linear fashion.  We are able to palpate the cervical musculature and followed the avascular plane down to the C2 spinous process.  On the superior aspect of the C2 spinous process we followed this out to the C2 lamina.  We are able to then palpate the C1 ring.  The C1 ring was subperiosteal dissected laterally, with care taken not to go too far lateral to avoid the vertebral artery.  At this point we are able to palpate the base of the occiput.  Utilizing a upgoing curette the connective tissue was dissected from the deep aspect of the C1 ring.  We did this until we are able to pass a [facility] across this level.  We then repeated this process at the C2 space after performing a small hemilaminotom. utilizing sharp dissection and curettes we are able to open the posterior ligament to expose the dura.  Once the dura was well exposed we then placed the trial dilator and the intended location.  We verified that this passed easily.  Once we are able to do this we ensured that we would have a good location for the spinal cord stimulator paddle.  The superior aspect of the C1 ring was again exposed and isolated.  The paddle was then placed in a retrograde fashion from the occiput to C1 interval, once we are able to see the paddle between C1 and C2 we are able to utilize a bayonet to  help pass this down more caudally.  We did this until the paddle was spanning the C4-C2 levels.  We verified this with fluoroscopy.  We also verified this with anterior posterior views to ensure the midline nature.  In order to ensure that we were getting the desired stimulation effect, we performed a collision test with intraoperative neuromonitoring.  This verify that we were getting upper and lower extremity coverage bilaterally as well as coverage in  the neck.  We then utilized a suture through the connective tissue immediately cranial to the C1 ring to secure the leads into place.  We then irrigated copiously.  We obtained meticulous hemostasis.  At this point we are then ready to pass down to the pocket site.  Utilizing a dilator we created a subcutaneous pocket from the cervical incision down to the planned battery pocket space on the  flank.   We then passed the leads down to the pocket and connected to the battery site.  .  This was irrigated copiously with antibiotic containing solution.  Once we are ready for closure we verified with final x-rays verifying that there was no lead migration.  We placed some tissue glue at the paddle location.   The wound was closed in multiple layers with sterile dressings applied.   Monitoring was stable throughout.   Patient was then rotated back to the preoperative bed awakened from anesthesia and taken to recovery. All counts are correct in this case.   I performed the entire procedure with the assistance of Edsel Goods PA as an designer, television/film set. An assistant was required for this procedure due to the complexity.  The assistant provided assistance in tissue manipulation and suction, and was required for the successful and safe performance of the procedure. I performed the critical portions of the procedure.     Penne MICAEL Sharps, MD

## 2024-02-26 ENCOUNTER — Encounter: Admitting: Physician Assistant

## 2024-03-17 ENCOUNTER — Encounter: Admitting: Neurosurgery

## 2024-03-24 ENCOUNTER — Encounter
